# Patient Record
Sex: Female | Born: 1955 | Race: White | Hispanic: No | Marital: Married | Smoking: Never smoker
Health system: Southern US, Community
[De-identification: ages and names within clinical notes are randomized; demographics above are authoritative.]

## PROBLEM LIST (undated history)

## (undated) DIAGNOSIS — S83207A Unspecified tear of unspecified meniscus, current injury, left knee, initial encounter: Secondary | ICD-10-CM

## (undated) HISTORY — PX: TONSILLECTOMY: SUR1361

## (undated) HISTORY — DX: Unspecified tear of unspecified meniscus, current injury, left knee, initial encounter: S83.207A

## (undated) HISTORY — PX: WISDOM TOOTH EXTRACTION: SHX21

---

## 1998-08-25 ENCOUNTER — Other Ambulatory Visit: Admission: RE | Admit: 1998-08-25 | Discharge: 1998-08-25 | Payer: Self-pay | Admitting: Obstetrics and Gynecology

## 1999-03-01 ENCOUNTER — Encounter: Payer: Self-pay | Admitting: Emergency Medicine

## 1999-03-01 ENCOUNTER — Emergency Department (HOSPITAL_COMMUNITY): Admission: EM | Admit: 1999-03-01 | Discharge: 1999-03-01 | Payer: Self-pay | Admitting: Emergency Medicine

## 2000-08-14 ENCOUNTER — Ambulatory Visit (HOSPITAL_COMMUNITY): Admission: RE | Admit: 2000-08-14 | Discharge: 2000-08-14 | Payer: Self-pay | Admitting: Gastroenterology

## 2001-02-21 ENCOUNTER — Other Ambulatory Visit: Admission: RE | Admit: 2001-02-21 | Discharge: 2001-02-21 | Payer: Self-pay

## 2001-04-11 ENCOUNTER — Encounter: Payer: Self-pay | Admitting: Obstetrics and Gynecology

## 2001-04-11 ENCOUNTER — Ambulatory Visit (HOSPITAL_COMMUNITY): Admission: RE | Admit: 2001-04-11 | Discharge: 2001-04-11 | Payer: Self-pay | Admitting: Obstetrics and Gynecology

## 2003-11-27 ENCOUNTER — Other Ambulatory Visit: Admission: RE | Admit: 2003-11-27 | Discharge: 2003-11-27 | Payer: Self-pay | Admitting: Obstetrics and Gynecology

## 2006-06-27 ENCOUNTER — Other Ambulatory Visit: Admission: RE | Admit: 2006-06-27 | Discharge: 2006-06-27 | Payer: Self-pay | Admitting: Obstetrics and Gynecology

## 2011-01-09 ENCOUNTER — Encounter: Payer: Self-pay | Admitting: Obstetrics & Gynecology

## 2013-11-28 ENCOUNTER — Emergency Department (HOSPITAL_COMMUNITY): Payer: BC Managed Care – PPO

## 2013-11-28 ENCOUNTER — Encounter (HOSPITAL_COMMUNITY): Payer: Self-pay | Admitting: Emergency Medicine

## 2013-11-28 ENCOUNTER — Emergency Department (HOSPITAL_COMMUNITY)
Admission: EM | Admit: 2013-11-28 | Discharge: 2013-11-28 | Disposition: A | Discharge status: Home / Self Care | Payer: BC Managed Care – PPO | Attending: Emergency Medicine | Admitting: Emergency Medicine

## 2013-11-28 DIAGNOSIS — R6883 Chills (without fever): Secondary | ICD-10-CM | POA: Insufficient documentation

## 2013-11-28 DIAGNOSIS — R911 Solitary pulmonary nodule: Secondary | ICD-10-CM

## 2013-11-28 DIAGNOSIS — R52 Pain, unspecified: Secondary | ICD-10-CM | POA: Insufficient documentation

## 2013-11-28 DIAGNOSIS — M546 Pain in thoracic spine: Secondary | ICD-10-CM | POA: Insufficient documentation

## 2013-11-28 DIAGNOSIS — R109 Unspecified abdominal pain: Secondary | ICD-10-CM | POA: Insufficient documentation

## 2013-11-28 LAB — COMPREHENSIVE METABOLIC PANEL
ALT: 20 U/L (ref 0–35)
Alkaline Phosphatase: 72 U/L (ref 39–117)
BUN: 21 mg/dL (ref 6–23)
CO2: 24 mEq/L (ref 19–32)
Chloride: 106 mEq/L (ref 96–112)
GFR calc Af Amer: 81 mL/min — ABNORMAL LOW (ref >90)
GFR calc non Af Amer: 70 mL/min — ABNORMAL LOW (ref >90)
Glucose, Bld: 103 mg/dL — ABNORMAL HIGH (ref 70–99)
Potassium: 3.6 mEq/L (ref 3.5–5.1)
Sodium: 141 mEq/L (ref 135–145)
Total Bilirubin: 0.3 mg/dL (ref 0.3–1.2)
Total Protein: 7 g/dL (ref 6.0–8.3)

## 2013-11-28 LAB — URINALYSIS, ROUTINE W REFLEX MICROSCOPIC
Bilirubin Urine: NEGATIVE
Hgb urine dipstick: NEGATIVE
Nitrite: NEGATIVE
Specific Gravity, Urine: 1.019 (ref 1.005–1.030)
Urobilinogen, UA: 0.2 mg/dL (ref 0.0–1.0)

## 2013-11-28 LAB — CBC WITH DIFFERENTIAL/PLATELET
Eosinophils Relative: 1 % (ref 0–5)
Hemoglobin: 13.7 g/dL (ref 12.0–15.0)
Lymphocytes Relative: 29 % (ref 12–46)
Lymphs Abs: 2.1 10*3/uL (ref 0.7–4.0)
Monocytes Absolute: 0.4 10*3/uL (ref 0.1–1.0)
Monocytes Relative: 5 % (ref 3–12)
Neutro Abs: 4.7 10*3/uL (ref 1.7–7.7)
Neutrophils Relative %: 64 % (ref 43–77)
Platelets: 220 10*3/uL (ref 150–400)
RBC: 4.43 MIL/uL (ref 3.87–5.11)
WBC: 7.3 10*3/uL (ref 4.0–10.5)

## 2013-11-28 LAB — LIPASE, BLOOD: Lipase: 62 U/L — ABNORMAL HIGH (ref 11–59)

## 2013-11-28 MED ORDER — DIAZEPAM 5 MG PO TABS: 5.0000 mg | ORAL_TABLET | Freq: Four times a day (QID) | ORAL | Status: DC | PRN

## 2013-11-28 MED ORDER — HYDROCODONE-ACETAMINOPHEN 5-325 MG PO TABS: 2.0000 | ORAL_TABLET | ORAL | Status: DC | PRN

## 2013-11-28 MED ORDER — HYDROMORPHONE HCL PF 1 MG/ML IJ SOLN
0.5000 mg | Freq: Once | INTRAMUSCULAR | Status: AC
  Administered 2013-11-28: 0.5 mg via INTRAVENOUS
  Filled 2013-11-28: qty 1

## 2013-11-28 MED ORDER — IOHEXOL 300 MG/ML  SOLN
80.0000 mL | Freq: Once | INTRAMUSCULAR | Status: AC | PRN
  Administered 2013-11-28: 80 mL via INTRAVENOUS

## 2013-11-28 MED ORDER — KETOROLAC TROMETHAMINE 30 MG/ML IJ SOLN
30.0000 mg | Freq: Once | INTRAMUSCULAR | Status: AC
  Administered 2013-11-28: 30 mg via INTRAVENOUS
  Filled 2013-11-28: qty 1

## 2013-11-28 MED ORDER — ONDANSETRON HCL 4 MG/2ML IJ SOLN
4.0000 mg | Freq: Once | INTRAMUSCULAR | Status: AC
  Administered 2013-11-28: 4 mg via INTRAVENOUS
  Filled 2013-11-28: qty 2

## 2013-11-28 MED ORDER — MORPHINE SULFATE 4 MG/ML IJ SOLN
4.0000 mg | Freq: Once | INTRAMUSCULAR | Status: AC
  Administered 2013-11-28: 4 mg via INTRAVENOUS
  Filled 2013-11-28: qty 1

## 2013-11-28 MED ORDER — IOHEXOL 300 MG/ML  SOLN
25.0000 mL | INTRAMUSCULAR | Status: AC
  Administered 2013-11-28: 25 mL via ORAL

## 2013-11-28 NOTE — ED Notes (Signed)
DR Jodi Mourning IN TO DISCUSS RESULTS AND DISCHARGE PLAN WITH PATIENT

## 2013-11-28 NOTE — ED Notes (Signed)
Report given to Anette, RN, Pt. Will be transferred to Pod C to wait for results.  Pt. Updated with plan of care

## 2013-11-28 NOTE — ED Provider Notes (Signed)
Signed out with right flank pain, CT pending. Rechecked, pain meds given, pt comfortable as long as not moving. Right flank/ paraspinal tenderness on exam. MSK? Ct Abdomen Pelvis W Wo Contrast  11/28/2013   CLINICAL DATA:  Right flank pain.  EXAM: CT ABDOMEN AND PELVIS WITHOUT AND WITH CONTRAST  TECHNIQUE: Multidetector CT imaging of the abdomen and pelvis was performed without contrast material in one or both body regions, followed by contrast material(s) and further sections in one or both body regions.  CONTRAST:  80mL OMNIPAQUE IOHEXOL 300 MG/ML  SOLN  COMPARISON:  None.  FINDINGS: A 5 mm noncalcified pulmonary nodules noted in the right lower lobe on image 6 of series 8. No evidence of renal calculi or hydronephrosis. No evidence of ureteral calculi or dilatation. No bladder calculi identified.  The liver, gallbladder, pancreas, spleen, adrenal glands, and kidneys are normal in appearance. No evidence of hydronephrosis. No soft tissue masses or lymphadenopathy identified.  Uterus and adnexal regions are unremarkable. No evidence of inflammatory process or abnormal fluid collections. Normal appendix is visualized. No evidence of bowel wall thickening, dilatation, or hernia.  IMPRESSION: No acute findings or other significant abnormality within the abdomen or pelvis.  5 mm indeterminate pulmonary nodule in the left lower lobe. If the patient is at high risk for bronchogenic carcinoma, follow-up chest CT at 6-12 months is recommended. If the patient is at low risk for bronchogenic carcinoma, follow-up chest CT at 12 months is recommended. This recommendation follows the consensus statement: Guidelines for Management of Small Pulmonary Nodules Detected on CT Scans: A Statement from the Fleischner Society as published in Radiology 2005;237:395-400.   Electronically Signed   By: Myles Rosenthal M.D.   On: 11/28/2013 10:35   Dg Chest 2 View  11/28/2013   CLINICAL DATA:  Right flank pain.  EXAM: CHEST  2 VIEW   COMPARISON:  None.  FINDINGS: The lungs are well-aerated and clear. There is no evidence of focal opacification, pleural effusion or pneumothorax.  The heart is normal in size; the mediastinal contour is within normal limits. No acute osseous abnormalities are seen.  IMPRESSION: No acute cardiopulmonary process seen.   Electronically Signed   By: Roanna Raider M.D.   On: 11/28/2013 06:22   CT no acute findings, discussed CT fup in 1 yr for nodule.  Pt comfortable with plan and fup discussed.   Hidaya Daniel M  Right flank pain  Enid Skeens, MD 11/28/13 [phone]

## 2013-11-28 NOTE — ED Notes (Signed)
Pt has arrived on pod c and her ct is resulted. Dr Jodi Mourning is aware and is preparing pt discharge instructions

## 2013-11-28 NOTE — ED Provider Notes (Signed)
CSN: [id number]     Arrival date & time 11/28/13  0443 History   First MD Initiated Contact with Patient 11/28/13 [phone]     Chief Complaint  Patient presents with  . Flank Pain   (Consider location/radiation/quality/duration/timing/severity/associated sxs/prior Treatment) HPI Patient presents with 2 days of stabbing right-sided flank pain. The pain is worse with movement. No known trauma or inciting event. Was seen by her primary care Dr. and had labs/urine which were normal. Patient states the pain has worsened tonight. She's had no shortness of breath or cough. She denies any fevers had one episode of chills. She's had no lower extremity swelling or pain. She denies any nausea or vomiting. She's had no diarrhea or constipation. She denies urinary frequency or hematuria. She's been taking Tylenol at home with minimal relief. Denies recent extended travel or surgery. History reviewed. No pertinent past medical history. Past Surgical History  Procedure Laterality Date  . Cesarean section    . Tonsillectomy    . Wisdom tooth extraction     No family history on file. History  Substance Use Topics  . Smoking status: Never Smoker   . Smokeless tobacco: Not on file  . Alcohol Use: Yes   OB History   Grav Para Term Preterm Abortions TAB SAB Ect Mult Living                 Review of Systems  Constitutional: Positive for chills. Negative for fever.  Respiratory: Negative for cough and wheezing.   Cardiovascular: Negative for chest pain, palpitations and leg swelling.  Gastrointestinal: Negative for nausea, vomiting, abdominal pain, diarrhea and constipation.  Genitourinary: Positive for flank pain. Negative for dysuria, frequency and hematuria.  Musculoskeletal: Positive for back pain and myalgias. Negative for neck pain and neck stiffness.  Skin: Negative for rash and wound.  Neurological: Negative for dizziness, weakness, light-headedness, numbness and headaches.  All other systems  reviewed and are negative.    Allergies  Demerol  Home Medications  No current outpatient prescriptions on file. BP 136/73  Pulse 74  Temp(Src) 97.7 F (36.5 C) (Oral)  Resp 16  Ht 5' 8.5" (1.74 m)  SpO2 100% Physical Exam  Nursing note and vitals reviewed. Constitutional: She is oriented to person, place, and time. She appears well-developed and well-nourished. No distress.  HENT:  Head: Normocephalic and atraumatic.  Mouth/Throat: Oropharynx is clear and moist.  Eyes: EOM are normal. Pupils are equal, round, and reactive to light.  Neck: Normal range of motion. Neck supple.  Cardiovascular: Normal rate and regular rhythm.   Pulmonary/Chest: Effort normal and breath sounds normal. No respiratory distress. She has no wheezes. She has no rales. She exhibits no tenderness.  Abdominal: Soft. Bowel sounds are normal. She exhibits no distension and no mass. There is no tenderness. There is no rebound and no guarding.  Musculoskeletal: Normal range of motion. She exhibits tenderness. She exhibits no edema.  Patient has tenderness to palpation on the right lateral inferior thoracic region. She has no crepitance or deformity. The pain is worse with movement. The patient has no CVA tenderness bilaterally. She has no midline thoracic or lumbar tenderness. She has no calf swelling or pain.  Neurological: She is alert and oriented to person, place, and time.  Patient is alert and oriented x3 with clear, goal oriented speech. Patient has 5/5 motor in all extremities. Sensation is intact to light touch.  Skin: Skin is warm and dry. No rash noted. No erythema.  Psychiatric: She  has a normal mood and affect. Her behavior is normal.    ED Course  Procedures (including critical care time) Labs Review Labs Reviewed  CBC WITH DIFFERENTIAL  COMPREHENSIVE METABOLIC PANEL  LIPASE, BLOOD  URINALYSIS, ROUTINE W REFLEX MICROSCOPIC  D-DIMER, QUANTITATIVE   Imaging Review No results found.  EKG  Interpretation   None       MDM     Loren Racer, MD 12/04/13 [phone]

## 2013-11-28 NOTE — ED Notes (Signed)
PT AMBULATORY TO RESTROOM TO DRESS

## 2013-11-28 NOTE — ED Notes (Addendum)
Pt states she started having right flank pain on Monday of this week, pt states she was seen by her primary care doctor and he wanted to pursue a CT scan. Pt declined CT scan as she thought it would get better. Pt states the CT scan is necessary now. Pt states the pain feels like something is clamping down, pt states when the pain is at its worst she has to hunch forward. Pt denies problems urinating, passing stool, SOB, N/V/D, fever. Pt is A&O X4. Pt reports pain with walking, laying, sitting.

## 2014-11-09 ENCOUNTER — Emergency Department (HOSPITAL_COMMUNITY)
Admission: EM | Admit: 2014-11-09 | Discharge: 2014-11-09 | Disposition: A | Discharge status: Home / Self Care | Payer: BC Managed Care – PPO | Attending: Emergency Medicine | Admitting: Emergency Medicine

## 2014-11-09 ENCOUNTER — Encounter (HOSPITAL_COMMUNITY): Payer: Self-pay | Admitting: *Deleted

## 2014-11-09 ENCOUNTER — Emergency Department (HOSPITAL_COMMUNITY): Payer: BC Managed Care – PPO

## 2014-11-09 DIAGNOSIS — M79602 Pain in left arm: Secondary | ICD-10-CM | POA: Diagnosis not present

## 2014-11-09 DIAGNOSIS — R6884 Jaw pain: Secondary | ICD-10-CM

## 2014-11-09 DIAGNOSIS — Z79899 Other long term (current) drug therapy: Secondary | ICD-10-CM | POA: Insufficient documentation

## 2014-11-09 DIAGNOSIS — Z7982 Long term (current) use of aspirin: Secondary | ICD-10-CM | POA: Diagnosis not present

## 2014-11-09 LAB — CBC
HCT: 42.5 % (ref 36.0–46.0)
HEMOGLOBIN: 14.3 g/dL (ref 12.0–15.0)
MCH: 30.1 pg (ref 26.0–34.0)
MCHC: 33.6 g/dL (ref 30.0–36.0)
MCV: 89.5 fL (ref 78.0–100.0)
PLATELETS: 246 10*3/uL (ref 150–400)
RBC: 4.75 MIL/uL (ref 3.87–5.11)
RDW: 12.1 % (ref 11.5–15.5)
WBC: 5.6 10*3/uL (ref 4.0–10.5)

## 2014-11-09 LAB — BASIC METABOLIC PANEL
ANION GAP: 12 (ref 5–15)
BUN: 12 mg/dL (ref 6–23)
CALCIUM: 9 mg/dL (ref 8.4–10.5)
CO2: 28 mEq/L (ref 19–32)
Chloride: 104 mEq/L (ref 96–112)
Creatinine, Ser: 0.83 mg/dL (ref 0.50–1.10)
GFR, EST AFRICAN AMERICAN: 88 mL/min — AB (ref >90)
GFR, EST NON AFRICAN AMERICAN: 76 mL/min — AB (ref >90)
Glucose, Bld: 95 mg/dL (ref 70–99)
Potassium: 3.5 mEq/L — ABNORMAL LOW (ref 3.7–5.3)
Sodium: 144 mEq/L (ref 137–147)

## 2014-11-09 LAB — I-STAT TROPONIN, ED: TROPONIN I, POC: 0 ng/mL (ref 0.00–0.08)

## 2014-11-09 LAB — TROPONIN I

## 2014-11-09 MED ORDER — ASPIRIN 81 MG PO CHEW
324.0000 mg | CHEWABLE_TABLET | Freq: Once | ORAL | Status: AC
  Administered 2014-11-09: 324 mg via ORAL
  Filled 2014-11-09: qty 4

## 2014-11-09 NOTE — ED Notes (Signed)
thge pt has has hgad lt jhaw pain for 3 days and today she has had lt arm pain while walking.  boith her upper and lower teeth are painful.   lmp   None   She has had bauysea 3 daysbago when the jaw pain started.

## 2014-11-09 NOTE — ED Provider Notes (Signed)
CSN: [id number]     Arrival date & time 11/09/14  1735 History   First MD Initiated Contact with Patient 11/09/14 1803     Chief Complaint  Patient presents with  . Jaw Pain     (Consider location/radiation/quality/duration/timing/severity/associated sxs/prior Treatment) HPI Comments: Had L sided jaw pain all night 2 nights ago. L sided, entire upper and lower row of teeth. Patient had just seen dentist recently and had a good checkup.  No fevers, no vomiting.  Patient is a 58 y.o. female presenting with extremity pain. The history is provided by the patient.  Extremity Pain This is a new problem. The current episode started 3 to 5 hours ago. The problem occurs constantly. The problem has been resolved. Pertinent negatives include no abdominal pain and no shortness of breath. Exacerbated by: exertion. The symptoms are relieved by rest. She has tried nothing for the symptoms. The treatment provided no relief.    History reviewed. No pertinent past medical history. Past Surgical History  Procedure Laterality Date  . Cesarean section    . Tonsillectomy    . Wisdom tooth extraction     No family history on file. History  Substance Use Topics  . Smoking status: Never Smoker   . Smokeless tobacco: Not on file  . Alcohol Use: Yes   OB History    No data available     Review of Systems  Constitutional: Negative for fever.  Respiratory: Negative for cough and shortness of breath.   Gastrointestinal: Positive for nausea. Negative for vomiting and abdominal pain.  All other systems reviewed and are negative.     Allergies  Demerol  Home Medications   Prior to Admission medications   Medication Sig Start Date End Date Taking? Authorizing Provider  aspirin 81 MG chewable tablet Chew 81 mg by mouth daily.    Historical Provider, MD  cholecalciferol (VITAMIN D) 1000 UNITS tablet Take 1,000 Units by mouth daily.    Historical Provider, MD  diazepam (VALIUM) 5 MG tablet Take 1  tablet (5 mg total) by mouth every 6 (six) hours as needed for anxiety (spasms). 11/28/13   Enid SkeensJoshua M Zavitz, MD  doxylamine, Sleep, (UNISOM) 25 MG tablet Take 25 mg by mouth at bedtime as needed.    Historical Provider, MD  HYDROcodone-acetaminophen (NORCO) 5-325 MG per tablet Take 2 tablets by mouth every 4 (four) hours as needed. 11/28/13   Enid SkeensJoshua M Zavitz, MD  Melatonin 1 MG CAPS Take 1 capsule by mouth at bedtime as needed.    Historical Provider, MD  vitamin B-12 (CYANOCOBALAMIN) 1000 MCG tablet Take 1,000 mcg by mouth daily.    Historical Provider, MD   BP 144/64 mmHg  Pulse 77  Temp(Src) 98 F (36.7 C)  Resp 15  Ht 5' 8.5" (1.74 m)  Wt 244 lb (110.678 kg)  BMI 36.56 kg/m2  SpO2 99% Physical Exam  Constitutional: She is oriented to person, place, and time. She appears well-developed and well-nourished. No distress.  HENT:  Head: Normocephalic and atraumatic.  Mouth/Throat: Oropharynx is clear and moist. No oropharyngeal exudate.  Eyes: EOM are normal. Pupils are equal, round, and reactive to light.  Neck: Normal range of motion. Neck supple.  Cardiovascular: Normal rate and regular rhythm.  Exam reveals no friction rub.   No murmur heard. Pulmonary/Chest: Effort normal and breath sounds normal. No respiratory distress. She has no wheezes. She has no rales.  Abdominal: Soft. She exhibits no distension. There is no tenderness. There is no  rebound.  Musculoskeletal: Normal range of motion. She exhibits no edema.  Neurological: She is alert and oriented to person, place, and time.  Skin: No rash noted. She is not diaphoretic.  Nursing note and vitals reviewed.   ED Course  Procedures (including critical care time) Labs Review Labs Reviewed  CBC  BASIC METABOLIC PANEL  TROPONIN I    Imaging Review Dg Chest 2 View  11/09/2014   CLINICAL DATA:  Left-sided jaw pain.  EXAM: CHEST  2 VIEW  COMPARISON:  11/28/2013  FINDINGS: The heart size and mediastinal contours are within  normal limits. Both lungs are clear. The visualized skeletal structures are unremarkable.  IMPRESSION: No active cardiopulmonary disease.   Electronically Signed   By: Signa Kellaylor  Stroud M.D.   On: 11/09/2014 19:14     EKG Interpretation   Date/Time:  Sunday November 09 2014 17:48:33 EST Ventricular Rate:  92 PR Interval:  150 QRS Duration: 86 QT Interval:  346 QTC Calculation: 427 R Axis:   82 Text Interpretation:  Normal sinus rhythm Normal ECG No prior for  comparison Confirmed by Gwendolyn GrantWALDEN  MD, Jamarious Febo ([id number]) on 11/09/2014 6:03:51 PM      MDM   Final diagnoses:  Pain of left upper extremity    58 year old female here with symptoms concerning for possible ACS. She had a multiple hour episode of left-sided jaw pain all night 2 nights ago. Today she had exertional left arm pain when walking briskly in the cold. This resolved about 3-4 hours ago. Had some mild nausea with each episode, but no diaphoresis, no shortness of breath, no chest pain or pressure. She has no family history of cardiac disease and no risk factors other than obesity. Patient is a low risk heart score. EKG is normal. Plan on serial enzymes. I discussed patient's options of staying for cardiology bowel versus going home and following up for a stress test that she would rather follow-up as an outpatient. Pain free at this time. Will give aspirin.  Serial troponins normal. Stable for discharge.   Elwin MochaBlair Patrena Santalucia, MD 11/09/14 2312

## 2014-11-09 NOTE — Discharge Instructions (Signed)
Please take a 324 mg aspirin daily until you see your regular doctor.  Chest Pain (Nonspecific) It is often hard to give a specific diagnosis for the cause of chest pain. There is always a chance that your pain could be related to something serious, such as a heart attack or a blood clot in the lungs. You need to follow up with your health care provider for further evaluation. CAUSES   Heartburn.  Pneumonia or bronchitis.  Anxiety or stress.  Inflammation around your heart (pericarditis) or lung (pleuritis or pleurisy).  A blood clot in the lung.  A collapsed lung (pneumothorax). It can develop suddenly on its own (spontaneous pneumothorax) or from trauma to the chest.  Shingles infection (herpes zoster virus). The chest wall is composed of bones, muscles, and cartilage. Any of these can be the source of the pain.  The bones can be bruised by injury.  The muscles or cartilage can be strained by coughing or overwork.  The cartilage can be affected by inflammation and become sore (costochondritis). DIAGNOSIS  Lab tests or other studies may be needed to find the cause of your pain. Your health care provider may have you take a test called an ambulatory electrocardiogram (ECG). An ECG records your heartbeat patterns over a 24-hour period. You may also have other tests, such as:  Transthoracic echocardiogram (TTE). During echocardiography, sound waves are used to evaluate how blood flows through your heart.  Transesophageal echocardiogram (TEE).  Cardiac monitoring. This allows your health care provider to monitor your heart rate and rhythm in real time.  Holter monitor. This is a portable device that records your heartbeat and can help diagnose heart arrhythmias. It allows your health care provider to track your heart activity for several days, if needed.  Stress tests by exercise or by giving medicine that makes the heart beat faster. TREATMENT   Treatment depends on what may be  causing your chest pain. Treatment may include:  Acid blockers for heartburn.  Anti-inflammatory medicine.  Pain medicine for inflammatory conditions.  Antibiotics if an infection is present.  You may be advised to change lifestyle habits. This includes stopping smoking and avoiding alcohol, caffeine, and chocolate.  You may be advised to keep your head raised (elevated) when sleeping. This reduces the chance of acid going backward from your stomach into your esophagus. Most of the time, nonspecific chest pain will improve within 2-3 days with rest and mild pain medicine.  HOME CARE INSTRUCTIONS   If antibiotics were prescribed, take them as directed. Finish them even if you start to feel better.  For the next few days, avoid physical activities that bring on chest pain. Continue physical activities as directed.  Do not use any tobacco products, including cigarettes, chewing tobacco, or electronic cigarettes.  Avoid drinking alcohol.  Only take medicine as directed by your health care provider.  Follow your health care provider's suggestions for further testing if your chest pain does not go away.  Keep any follow-up appointments you made. If you do not go to an appointment, you could develop lasting (chronic) problems with pain. If there is any problem keeping an appointment, call to reschedule. SEEK MEDICAL CARE IF:   Your chest pain does not go away, even after treatment.  You have a rash with blisters on your chest.  You have a fever. SEEK IMMEDIATE MEDICAL CARE IF:   You have increased chest pain or pain that spreads to your arm, neck, jaw, back, or abdomen.  You have shortness of breath.  You have an increasing cough, or you cough up blood.  You have severe back or abdominal pain.  You feel nauseous or vomit.  You have severe weakness.  You faint.  You have chills. This is an emergency. Do not wait to see if the pain will go away. Get medical help at once.  Call your local emergency services (911 in [place] Do not drive yourself to the hospital. MAKE SURE YOU:   Understand these instructions.  Will watch your condition.  Will get help right away if you are not doing well or get worse. Document Released: 09/14/2005 Document Revised: 12/10/2013 Document Reviewed: 07/10/2008 [facility] Patient Information 2015 [place], [place]. This information is not intended to replace advice given to you by your health care provider. Make sure you discuss any questions you have with your health care provider.

## 2014-11-09 NOTE — ED Notes (Signed)
Pt transported to xray 

## 2014-11-25 ENCOUNTER — Other Ambulatory Visit: Payer: Self-pay

## 2014-11-25 DIAGNOSIS — Z1231 Encounter for screening mammogram for malignant neoplasm of breast: Secondary | ICD-10-CM

## 2014-12-16 ENCOUNTER — Ambulatory Visit: Payer: BC Managed Care – PPO

## 2014-12-19 DIAGNOSIS — S83207A Unspecified tear of unspecified meniscus, current injury, left knee, initial encounter: Secondary | ICD-10-CM

## 2014-12-19 HISTORY — DX: Unspecified tear of unspecified meniscus, current injury, left knee, initial encounter: S83.207A

## 2015-01-08 ENCOUNTER — Encounter: Payer: Self-pay | Admitting: Neurology

## 2015-01-13 ENCOUNTER — Encounter: Payer: Self-pay | Admitting: Neurology

## 2015-01-13 ENCOUNTER — Ambulatory Visit (INDEPENDENT_AMBULATORY_CARE_PROVIDER_SITE_OTHER): Payer: BLUE CROSS/BLUE SHIELD | Admitting: Neurology

## 2015-01-13 VITALS — BP 116/73 | HR 83 | Resp 22 | Ht 68.5 in | Wt 248.0 lb

## 2015-01-13 DIAGNOSIS — G4726 Circadian rhythm sleep disorder, shift work type: Secondary | ICD-10-CM

## 2015-01-13 DIAGNOSIS — F5102 Adjustment insomnia: Secondary | ICD-10-CM | POA: Insufficient documentation

## 2015-01-13 DIAGNOSIS — R0683 Snoring: Secondary | ICD-10-CM

## 2015-01-13 MED ORDER — ZOLPIDEM TARTRATE 5 MG PO TABS: 5.0000 mg | ORAL_TABLET | Freq: Every evening | ORAL | Status: DC | PRN

## 2015-01-13 NOTE — Patient Instructions (Signed)
Insomnia Insomnia is frequent trouble falling and/or staying asleep. Insomnia can be a long term problem or a short term problem. Both are common. Insomnia can be a short term problem when the wakefulness is related to a certain stress or worry. Long term insomnia is often related to ongoing stress during waking hours and/or poor sleeping habits. Overtime, sleep deprivation itself can make the problem worse. Every little thing feels more severe because you are overtired and your ability to cope is decreased. CAUSES   Stress, anxiety, and depression.  Poor sleeping habits.  Distractions such as TV in the bedroom.  Naps close to bedtime.  Engaging in emotionally charged conversations before bed.  Technical reading before sleep.  Alcohol and other sedatives. They may make the problem worse. They can hurt normal sleep patterns and normal dream activity.  Stimulants such as caffeine for several hours prior to bedtime.  Pain syndromes and shortness of breath can cause insomnia.  Exercise late at night.  Changing time zones may cause sleeping problems (jet lag). It is sometimes helpful to have someone observe your sleeping patterns. They should look for periods of not breathing during the night (sleep apnea). They should also look to see how long those periods last. If you live alone or observers are uncertain, you can also be observed at a sleep clinic where your sleep patterns will be professionally monitored. Sleep apnea requires a checkup and treatment. Give your caregivers your medical history. Give your caregivers observations your family has made about your sleep.  SYMPTOMS   Not feeling rested in the morning.  Anxiety and restlessness at bedtime.  Difficulty falling and staying asleep. TREATMENT   Your caregiver may prescribe treatment for an underlying medical disorders. Your caregiver can give advice or help if you are using alcohol or other drugs for self-medication. Treatment  of underlying problems will usually eliminate insomnia problems.  Medications can be prescribed for short time use. They are generally not recommended for lengthy use.  Over-the-counter sleep medicines are not recommended for lengthy use. They can be habit forming.  You can promote easier sleeping by making lifestyle changes such as:  Using relaxation techniques that help with breathing and reduce muscle tension.  Exercising earlier in the day.  Changing your diet and the time of your last meal. No night time snacks.  Establish a regular time to go to bed.  Counseling can help with stressful problems and worry.  Soothing music and white noise may be helpful if there are background noises you cannot remove.  Stop tedious detailed work at least one hour before bedtime. HOME CARE INSTRUCTIONS   Keep a diary. Inform your caregiver about your progress. This includes any medication side effects. See your caregiver regularly. Take note of:  Times when you are asleep.  Times when you are awake during the night.  The quality of your sleep.  How you feel the next day. This information will help your caregiver care for you.  Get out of bed if you are still awake after 15 minutes. Read or do some quiet activity. Keep the lights down. Wait until you feel sleepy and go back to bed.  Keep regular sleeping and waking hours. Avoid naps.  Exercise regularly.  Avoid distractions at bedtime. Distractions include watching television or engaging in any intense or detailed activity like attempting to balance the household checkbook.  Develop a bedtime ritual. Keep a familiar routine of bathing, brushing your teeth, climbing into bed at the same   time each night, listening to soothing music. Routines increase the success of falling to sleep faster.  Use relaxation techniques. This can be using breathing and muscle tension release routines. It can also include visualizing peaceful scenes. You can  also help control troubling or intruding thoughts by keeping your mind occupied with boring or repetitive thoughts like the old concept of counting sheep. You can make it more creative like imagining planting one beautiful flower after another in your backyard garden.  During your day, work to eliminate stress. When this is not possible use some of the previous suggestions to help reduce the anxiety that accompanies stressful situations. MAKE SURE YOU:   Understand these instructions.  Will watch your condition.  Will get help right away if you are not doing well or get worse. Document Released: 12/02/2000 Document Revised: 02/27/2012 Document Reviewed: 01/02/2008 [employer] Patient Information 2015 [employer]. This information is not intended to replace advice given to you by your health care provider. Make sure you discuss any questions you have with your health care provider.  

## 2015-01-13 NOTE — Progress Notes (Signed)
SLEEP MEDICINE CLINIC   Provider:  Melvyn Novas, M D  Referring Provider: Ezequiel Kayser, MD Primary Care Physician:  Ezequiel Kayser, MD  Chief Complaint  Patient presents with  . [facility] Sleep consult    Rm 11, alone    HPI:  Shelley Bean is a 59 y.o. female seen here as a referral from Dr. Waynard Edwards for a sleep consultation.   Shelley Bean reports that she has trouble falling asleep rather than staying asleep.  in the morning She has also history of snoring, witnessed by her husband. He has not reported that the patient is apneic but that she seems to snore around 4 AM . The patient relates the changes in her sleep pattern to weight gain, over the last 4-5 years about 45 pounds.  She injured her back and was unable to exercise.   As long as she raised her young children she was a less sound sleeper and more in need of being aware of her sleep surrounding. That is no longer the case yet she feels that she has not returned to a sound sleeping pattern. She endorsed the fatigue severity scale at 22 points and the Epworth Sleepiness Scale at 3 points which are not indicative of either fatigue or daytime sleepiness being present. She reports further that she likes to play on her computer or watching movies streaming on her computer and that the blue light or screen light seems to decrease her ability to fall asleep shortly after. Just recently she learned that this may be the cause of some of her sleep problems and she is in the process of implementing changes to her nocturnal habits. Shelley Bean  husband has notched her at times to roll over and she will not snore when not sleeping on her back or side, is still while sleeping prone.  She was told that she always breathes through the mouth. She has had a physical exam recently and was told by Dr. Waynard Edwards that her upper air way is narrow. 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   The patient has established a pattern that leads her to bed at  about 2 AM, about 30 minutes before she goes to bed she will take Unisom and melatonin. This allows her to fall asleep within about 30 minutes or less. She has been taking these supplements for over 3 years now. While on vacation once she  forgot these supplements and was unable to sleep.  She is usually able to sleep through the night when she takes her over-the-counter supplements, and rises at about 7 AM and often feels in need of a nap until 10 AM , feeling groggy.  She does not take any other daytime naps or afternoon naps. She will get about 5 hours of nocturnal sleep another 2 hours in the morning. She drinks 5 cups of coffee up to 6 PM, she is now aware of this being a possible insomnia contributor.   Dr. Waynard Edwards recently provided her with samples of Belsomra and the 5, 10 and 15 mg samples did not lead to the desired effect of falling asleep easier and on the contrary she awoke at 5 in the morning, wide awake.  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 The patient's husband Dr. Orvis Brill is a orthopedic surgeon was seems to do fine on 6 hours. The patient's father had witnessed snoring and apnea.     Review of Systems: Out  of a complete 14 system review, the patient complains of only the following symptoms, and all other reviewed systems are negative. Snoring, weight gain.   Epworth score  3 , Fatigue severity score 22  , depression score n/a .   History   Social History  . Marital Status: Married    Spouse Name: N/A    Number of Children: 3  . Years of Education: RN   Occupational History  . Not on file.   Social History Main Topics  . Smoking status: Never Smoker   . Smokeless tobacco: Not on file  . Alcohol Use: 0.0 oz/week    0 Not specified per week     Comment: socialholidays  . Drug Use: No  . Sexual Activity: Not on file   Other Topics Concern  . Not on file   Social History Narrative   Caffeine 5 cups daily avg.      History reviewed. No pertinent family history.  Past Medical History  Diagnosis Date  . Tear of meniscus of left knee 12/2014    wearing brace, doing PT    Past Surgical History  Procedure Laterality Date  . Cesarean section    . Tonsillectomy    . Wisdom tooth extraction      Current Outpatient Prescriptions  Medication Sig Dispense Refill  . acetaminophen (TYLENOL) 500 MG tablet Take 1,000 mg by mouth every 6 (six) hours as needed for headache (pain).    . Ascorbic Acid (VITAMIN C PO) Take 1 tablet by mouth at bedtime.    Marland Kitchen. aspirin 81 MG chewable tablet Chew 81 mg by mouth at bedtime.     . Cholecalciferol (VITAMIN D3) 5000 UNITS CAPS Take 5,000 Units by mouth at bedtime.    . Coenzyme Q10 (COQ10) 200 MG CAPS Take 200 mg by mouth at bedtime.    . Cyanocobalamin (VITAMIN B-12) 2500 MCG SUBL Place 2,500 mcg under the tongue at bedtime.    Marland Kitchen. doxylamine, Sleep, (UNISOM) 25 MG tablet Take 12.5 mg by mouth at bedtime as needed.     . Magnesium 250 MG TABS Take 250 mg by mouth at bedtime.    . Melatonin 3 MG TABS Take 3 mg by mouth at bedtime.     . Suvorexant (BELSOMRA PO) Take by mouth. Pt given samples 5mg , 10mg  20mg  samples to try (by Dr. Waynard EdwardsPerini).     No current facility-administered medications for this visit.    Allergies as of 01/13/2015 - Review Complete 01/13/2015  Allergen Reaction Noted  . Demerol [meperidine] Other (See Comments) 11/28/2013    Vitals: BP 116/73 mmHg  Pulse 83  Resp 22  Ht 5' 8.5" (1.74 m)  Wt 248 lb (112.492 kg)  BMI 37.16 kg/m2 Last Weight:  Wt Readings from Last 1 Encounters:  01/13/15 248 lb (112.492 kg)       Last Height:   Ht Readings from Last 1 Encounters:  01/13/15 5' 8.5" (1.74 m)    Physical exam:  General: The patient is awake, alert and appears not in acute distress. The patient is well groomed. Head: Normocephalic, atraumatic. Neck is supple. Mallampati 4 , invisible uvula  neck circumference: . Nasal airflow   unresricted , TMJ is not  evident . Retrognathia is seen.  Cardiovascular:  Regular rate and rhythm , without  murmurs or carotid bruit, and without distended neck veins. Respiratory: Lungs are clear to auscultation. Skin:  Without evidence of edema, or rash Trunk: BMI is  elevated and patient  has normal posture.  Neurologic exam : The patient is awake and alert, oriented to place and time.   Memory subjective   described as intact. There is a normal attention span & concentration ability.  Speech is fluent without   dysarthria, dysphonia or aphasia. Mood and affect are appropriate.  Cranial nerves: Pupils are equal and briskly reactive to light. Funduscopic exam without  evidence of pallor or edema.  Extraocular movements  in vertical and horizontal planes intact and without nystagmus. Visual fields by finger perimetry are intact. Hearing to finger rub intact.  Facial sensation intact to fine touch. Facial motor strength is symmetric and tongue and uvula move midline.  Motor exam:  Normal tone, muscle bulk and symmetric ,strength in all extremities.  Sensory:  Fine touch, pinprick and vibration were tested in all extremities.  Proprioception is tested in the upper extremities only. This was  normal.  Coordination: Rapid alternating movements in the fingers/hands is normal. Finger-to-nose maneuver without evidence of ataxia, dysmetria or tremor.  Gait and station: Patient walks without assistive device and is able unassisted to climb up to the exam table. Strength within normal limits.  She wears a knee brace.  Stance is stable and normal.  Deep tendon reflexes: in the  upper and lower extremities are symmetric and downgoing.   Assessment:  After physical and neurologic examination, review of laboratory studies, imaging, neurophysiology testing and pre-existing records, assessment is   1) main risk factor for snoring and possible apnea is her BMI, the patient gained weight over the  last 5 years and since has been snoring;  2) high grade mallompati and retrognathia, causing snoring, and risk factor for OSA.  3) insomnia is partially due to habits, light exposure and caffeine and partially due to shift work employment .  Nocturia 1-2 times is not a major factor.      The patient was advised of the nature of the diagnosed sleep disorder , the treatment options and risks for general a health and wellness arising from not treating the condition. Visit duration was 35 minutes. I advised Mrs. Ebling to eliminate blue light and screen light from the bedroom. I would like for her to try week by week to advance her bedtime by 15-20 minutes for that she finally hopefully has a bedtime of around midnight. In addition the bedroom should be called, quiet and dark. Caffeine should be restricted and only taken in up to lunchtime. I like for her to undergo a split night polysomnography as I suspect that she has obstructive sleep apnea to some degree. This will contribute to insomnia.  Melatonin should be continued , 5 mg at night.  Stop unisom as it makes her groggy.  She may try ambien 5 mg   Prn.   Plan:  Treatment plan and additional workup : see above.   CC Dr. Erline Hau Case Vassell MD  01/13/2015

## 2015-02-27 ENCOUNTER — Telehealth: Payer: Self-pay | Admitting: *Deleted

## 2015-02-27 NOTE — Telephone Encounter (Signed)
Left patient a message informing her that we needed to cancel her sleep study appointment.  She was advised to contact our office to reschedule with openings on Sunday and Wednesday of the following week.

## 2015-03-01 ENCOUNTER — Ambulatory Visit (INDEPENDENT_AMBULATORY_CARE_PROVIDER_SITE_OTHER): Payer: BLUE CROSS/BLUE SHIELD | Admitting: Neurology

## 2015-03-01 DIAGNOSIS — G4726 Circadian rhythm sleep disorder, shift work type: Secondary | ICD-10-CM

## 2015-03-01 DIAGNOSIS — G478 Other sleep disorders: Secondary | ICD-10-CM | POA: Diagnosis not present

## 2015-03-02 NOTE — Sleep Study (Signed)
Please see the scanned sleep study interpretation located in the Procedure tab within the Chart Review section. 

## 2015-03-10 ENCOUNTER — Encounter: Payer: Self-pay | Admitting: Neurology

## 2015-03-10 ENCOUNTER — Telehealth: Payer: Self-pay | Admitting: *Deleted

## 2015-03-10 ENCOUNTER — Other Ambulatory Visit: Payer: Self-pay | Admitting: Neurology

## 2015-03-10 ENCOUNTER — Encounter: Payer: Self-pay | Admitting: *Deleted

## 2015-03-10 DIAGNOSIS — G4733 Obstructive sleep apnea (adult) (pediatric): Secondary | ICD-10-CM

## 2015-03-10 NOTE — Telephone Encounter (Signed)
Patient was contacted and provided the results of her sleep study that did confirm a diagnosis of OSA.  Patient was advised that treatment in the form of CPAP therapy was considered effective in treatment and was recommended for home use.  The patient was in agreement and was referred to [facility] for CPAP set up.  Dr. Rodrigo RanMark Perini was faxed a copy of the report.  The patient gave verbal permission to mail a copy of her test results.   Patient instructed to contact our office 6-8 weeks post set up to schedule a follow up appointment.

## 2015-03-11 DIAGNOSIS — G4726 Circadian rhythm sleep disorder, shift work type: Secondary | ICD-10-CM | POA: Insufficient documentation

## 2015-04-13 ENCOUNTER — Encounter: Payer: Self-pay | Admitting: Neurology

## 2015-05-05 ENCOUNTER — Ambulatory Visit (INDEPENDENT_AMBULATORY_CARE_PROVIDER_SITE_OTHER): Payer: BLUE CROSS/BLUE SHIELD | Admitting: Neurology

## 2015-05-05 ENCOUNTER — Encounter: Payer: Self-pay | Admitting: Neurology

## 2015-05-05 VITALS — BP 120/70 | HR 72 | Resp 18 | Ht 68.9 in | Wt 258.0 lb

## 2015-05-05 DIAGNOSIS — Z9989 Dependence on other enabling machines and devices: Secondary | ICD-10-CM

## 2015-05-05 DIAGNOSIS — G4733 Obstructive sleep apnea (adult) (pediatric): Secondary | ICD-10-CM | POA: Diagnosis not present

## 2015-05-05 NOTE — Progress Notes (Signed)
SLEEP MEDICINE CLINIC   Provider:  Larey Seat, M D  Referring Provider: Crist Infante, MD Primary Care Physician:  Jerlyn Ly, MD  Chief Complaint  Patient presents with  . Follow-up    cpap, rm 10, alone    HPI:  Shelley Bean is a 59 y.o. female seen here as a referral from Dr. Joylene Draft for a sleep consultation.   Shelley Bean reports that she has trouble falling asleep rather than staying asleep.  in the morning She has also history of snoring, witnessed by her husband. He has not reported that the patient is apneic but that she seems to snore around 4 AM . The patient relates the changes in her sleep pattern to weight gain, over the last 4-5 years about 45 pounds.  She injured her back and was unable to exercise.   As long as she raised her young children she was a less sound sleeper and more in need of being aware of her sleep surrounding. That is no longer the case yet she feels that she has not returned to a sound sleeping pattern. She endorsed the fatigue severity scale at 22 points and the Epworth Sleepiness Scale at 3 points which are not indicative of either fatigue or daytime sleepiness being present. She reports further that she likes to play on her computer or watching movies streaming on her computer and that the blue light or screen light seems to decrease her ability to fall asleep shortly after. Just recently she learned that this may be the cause of some of her sleep problems and she is in the process of implementing changes to her nocturnal habits. Shelley Bean  husband has notched her at times to roll over and she will not snore when not sleeping on her back or side, is still while sleeping prone.  She was told that she always breathes through the mouth. She has had a physical exam recently and was told by Dr. Joylene Draft that her upper air way is narrow. 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   The patient has established a pattern that leads her to bed at about 2  AM, about 30 minutes before she goes to bed she will take Unisom and melatonin. This allows her to fall asleep within about 30 minutes or less. She has been taking these supplements for over 3 years now. While on vacation once she  forgot these supplements and was unable to sleep.  She is usually able to sleep through the night when she takes her over-the-counter supplements, and rises at about 7 AM and often feels in need of a nap until 10 AM , feeling groggy.  She does not take any other daytime naps or afternoon naps. She will get about 5 hours of nocturnal sleep another 2 hours in the morning. She drinks 5 cups of coffee up to 6 PM, she is now aware of this being a possible insomnia contributor.   Dr. Joylene Draft recently provided her with samples of Belsomra and the 5, 10 and 15 mg samples did not lead to the desired effect of falling asleep easier and on the contrary she awoke at 5 in the morning, wide awake.  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 The patient's husband Shelley Bean is a orthopedic surgeon was seems to do fine on 6 hours. The patient's father had witnessed snoring and apnea.   History from 05-05-15 Shelley Bean is here for his  first follow-up after her split night polysomnography which performed on 03-01-15. The patient has a very high RDI and AHI of 50.3 REM sleep was not seen and she spent none of her sleep in supine position. She had some periodic limb movements but none let to arousals her heart rate was stable she had isolated ectopies. After CPAP was initiated it was advanced to 9 cm water which she seemed to tolerate well and nasal mask by [employer] in medium size was used. The patient has been slowly getting adjusted to it but she said it was a challenge because she prefers to sleep problem and this is not possible with her headgear on. She was rather surprised that she would have that severe degree of sleep apnea ring was also noted. She  wakes up with impressions on her face from the nasal mask and she would probably prefer a pillow. Interested in a dream where mask and I can certainly change her prescription to that. We also met today to look at her download. She used the machine 100% of the time for the last 30 days and 29 days over 4 hours which makes and 97% compliant a good results. On average she sleeps 6 hours 35 minutes with the CPAP machine set at 9 cm water pressure with 3 cm EPR. She does have an AHI of 3.1. She does not use the humidifier.     Review of Systems: Out of a complete 14 system review, the patient complains of only the following symptoms, and all other reviewed systems are negative. Snoring, weight gain.   Epworth score  3 , Fatigue severity score 22  , depression score n/a .   History   Social History  . Marital Status: Married    Spouse Name: N/A  . Number of Children: 3  . Years of Education: RN   Occupational History  . Not on file.   Social History Main Topics  . Smoking status: Never Smoker   . Smokeless tobacco: Not on file  . Alcohol Use: 0.0 oz/week    0 Standard drinks or equivalent per week     Comment: socialholidays  . Drug Use: No  . Sexual Activity: Not on file   Other Topics Concern  . Not on file   Social History Narrative   Caffeine 1-2 cups daily avg.     Family History  Problem Relation Age of Onset  . Pulmonary Hypertension Mother   . Pulmonary fibrosis Father     Past Medical History  Diagnosis Date  . Tear of meniscus of left knee 12/2014    wearing brace, doing PT    Past Surgical History  Procedure Laterality Date  . Cesarean section    . Tonsillectomy    . Wisdom tooth extraction      Current Outpatient Prescriptions  Medication Sig Dispense Refill  . acetaminophen (TYLENOL) 500 MG tablet Take 1,000 mg by mouth every 6 (six) hours as needed for headache (pain).    . Ascorbic Acid (VITAMIN C PO) Take 1 tablet by mouth at bedtime.    Marland Kitchen aspirin  81 MG chewable tablet Chew 81 mg by mouth at bedtime.     . Cholecalciferol (VITAMIN D3) 5000 UNITS CAPS Take 5,000 Units by mouth at bedtime.    . Coenzyme Q10 (COQ10) 200 MG CAPS Take 200 mg by mouth at bedtime.    . Cyanocobalamin (VITAMIN B-12) 2500 MCG SUBL Place 2,500 mcg under the tongue at bedtime.    Marland Kitchen  doxylamine, Sleep, (UNISOM) 25 MG tablet Take 12.5 mg by mouth at bedtime as needed.     . Magnesium 250 MG TABS Take 250 mg by mouth at bedtime.    . Melatonin 3 MG TABS Take 3 mg by mouth at bedtime.     . Suvorexant (BELSOMRA PO) Take by mouth. Pt given samples 28m, 162m2043mamples to try (by Dr. PerJoylene Draft   . zMarland Kitchenlpidem (AMBIEN) 5 MG tablet Take 1 tablet (5 mg total) by mouth at bedtime as needed for sleep. (Patient not taking: Reported on 05/05/2015) 30 tablet 0   No current facility-administered medications for this visit.    Allergies as of 05/05/2015 - Review Complete 05/05/2015  Allergen Reaction Noted  . Demerol [meperidine] Other (See Comments) 11/28/2013    Vitals: BP 120/70 mmHg  Pulse 72  Resp 18  Ht 5' 8.9" (1.75 m)  Wt 258 lb (117.028 kg)  BMI 38.21 kg/m2 Last Weight:  Wt Readings from Last 1 Encounters:  05/05/15 258 lb (117.028 kg)       Last Height:   Ht Readings from Last 1 Encounters:  05/05/15 5' 8.9" (1.75 m)    Physical exam:  General: The patient is awake, alert and appears not in acute distress. The patient is well groomed. Head: Normocephalic, atraumatic. Neck is supple. Mallampati 4 , invisible uvula  neck circumference: 15.5 . Nasal airflow  unresricted , TMJ is not  evident . Retrognathia is seen.  Cardiovascular:  Regular rate and rhythm , without  murmurs or carotid bruit, and without distended neck veins. Respiratory: Lungs are clear to auscultation. Skin:  Without evidence of edema, or rash Trunk: BMI is  elevated and patient  has normal posture.  Neurologic exam : The patient is awake and alert, oriented to place and time.   Memory  subjective   described as intact. There is a normal attention span & concentration ability.  Speech is fluent without  dysarthria, dysphonia or aphasia. Mood and affect are appropriate.  Cranial nerves: Pupils are equal and briskly reactive to light. Funduscopic exam without  evidence of pallor or edema.  Extraocular movements  in vertical and horizontal planes intact and without nystagmus. Visual fields by finger perimetry are intact. Hearing to finger rub intact.  Facial sensation intact to fine touch. Facial motor strength is symmetric and tongue and uvula move midline.  Motor exam:  Normal tone, muscle bulk and symmetric ,strength in all extremities. Coordination: Rapid alternating movements in the fingers/hands is normal. Finger-to-nose maneuver without evidence of ataxia, dysmetria or tremor. Gait and station: Patient walks without assistive device and is able unassisted to climb up to the exam table.  Strength within normal limits.  She wears a knee brace.  Stance is stable and normal.  Deep tendon reflexes: in the upper and lower extremities are symmetric and downgoing.   Assessment:  After physical and neurologic examination, review of laboratory studies, imaging, neurophysiology testing and pre-existing records, assessment is   Obstructive sleep apnea to a severe degree with a 50.3 AHI. The patient has been doing well with the CPAP machine for compliance is very high. I will follow her compliance yearly but I will change her from a Fisher[employer] E[employer] nasal mask to a dream where mask because she is concerned about the pressure marks and the discomfort she feels with the head gear. Her insomnia has much improved. the patient has now nights without nocturia down from 3-4 bathroom breaks before. She also sleeps through  the night much more soundly.  insomnia is partially due to habits, light exposure and caffeine  and partially due to shift work employment .    Rv in 12 month. DME   [facility] / CC Dr. Cathlean Sauer Graves Nipp MD  05/05/2015

## 2015-05-05 NOTE — Patient Instructions (Signed)

## 2016-05-03 ENCOUNTER — Telehealth: Payer: Self-pay

## 2016-05-03 NOTE — Telephone Encounter (Signed)
Spoke to Randolpharolyn, NP and Dr. Vickey Hugerohmeier. Both agree that this pt is stable and can be seen by the NP. I spoke to pt and she is agreeable to seeing Eber Jonesarolyn, NP 05/03/16 at 3:15 instead of Dr. Vickey Hugerohmeier. Pt verbalized understanding to arrive 15 minutes early to her appt.

## 2016-05-04 ENCOUNTER — Encounter: Payer: Self-pay | Admitting: Nurse Practitioner

## 2016-05-04 ENCOUNTER — Ambulatory Visit (INDEPENDENT_AMBULATORY_CARE_PROVIDER_SITE_OTHER): Payer: BLUE CROSS/BLUE SHIELD | Admitting: Nurse Practitioner

## 2016-05-04 DIAGNOSIS — Z9989 Dependence on other enabling machines and devices: Secondary | ICD-10-CM

## 2016-05-04 DIAGNOSIS — G4733 Obstructive sleep apnea (adult) (pediatric): Secondary | ICD-10-CM | POA: Diagnosis not present

## 2016-05-04 DIAGNOSIS — G4726 Circadian rhythm sleep disorder, shift work type: Secondary | ICD-10-CM

## 2016-05-04 DIAGNOSIS — R0683 Snoring: Secondary | ICD-10-CM | POA: Diagnosis not present

## 2016-05-04 NOTE — Progress Notes (Signed)
[facility]  PATIENT: Shelley Bean DOB: [birth year]   REASON FOR VISIT: Obstructive sleep apnea on CPAP, obesity HISTORY FROM: Patient    HISTORY OF PRESENT ILLNESS: HISTORY CDGina Stephane Bean is a 60 y.o. female seen here as a referral from Dr. Joylene Draft for a sleep consultation.   Shelley Bean reports that she has trouble falling asleep rather than staying asleep. in the morning She has also history of snoring, witnessed by her husband. He has not reported that the patient is apneic but that she seems to snore around 4 AM . The patient relates the changes in her sleep pattern to weight gain, over the last 4-5 years about 45 pounds. She injured her back and was unable to exercise.  As long as she raised her young children she was a less sound sleeper and more in need of being aware of her sleep surrounding. That is no longer the case yet she feels that she has not returned to a sound sleeping pattern. She endorsed the fatigue severity scale at 22 points and the Epworth Sleepiness Scale at 3 points which are not indicative of either fatigue or daytime sleepiness being present. She reports further that she likes to play on her computer or watching movies streaming on her computer and that the blue light or screen light seems to decrease her ability to fall asleep shortly after. Just recently she learned that this may be the cause of some of her sleep problems and she is in the process of implementing changes to her nocturnal habits. Shelley Bean husband has notched her at times to roll over and she will not snore when not sleeping on her back or side, is still while sleeping prone.  She was told that she always breathes through the mouth. She has had a physical exam recently and was told by Dr. Joylene Draft that her upper air way is narrow. 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   The patient has established a pattern that leads her to bed at about 2 AM, about 30 minutes  before she goes to bed she will take Unisom and melatonin. This allows her to fall asleep within about 30 minutes or less. She has been taking these supplements for over 3 years now. While on vacation once she forgot these supplements and was unable to sleep.  She is usually able to sleep through the night when she takes her over-the-counter supplements, and rises at about 7 AM and often feels in need of a nap until 10 AM , feeling groggy.  She does not take any other daytime naps or afternoon naps. She will get about 5 hours of nocturnal sleep another 2 hours in the morning. She drinks 5 cups of coffee up to 6 PM, she is now aware of this being a possible insomnia contributor.   Dr. Joylene Draft recently provided her with samples of Belsomra and the 5, 10 and 15 mg samples did not lead to the desired effect of falling asleep easier and on the contrary she awoke at 5 in the morning, wide awake.  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 The patient's husband Dr. Malva Cogan is a orthopedic surgeon was seems to do fine on 6 hours. The patient's father had witnessed snoring and apnea.    05-05-15 CDMrs. Stodghill is here for his first follow-up after her split night polysomnography which performed on 03-01-15. The patient has a very high RDI and AHI  of 50.3 REM sleep was not seen and she spent none of her sleep in supine position. She had some periodic limb movements but none let to arousals her heart rate was stable she had isolated ectopies. After CPAP was initiated it was advanced to 9 cm water which she seemed to tolerate well and nasal mask by [employer] in medium size was used. The patient has been slowly getting adjusted to it but she said it was a challenge because she prefers to sleep problem and this is not possible with her headgear on. She was rather surprised that she would have that severe degree of sleep apnea ring was also noted. She wakes up with impressions on her  face from the nasal mask and she would probably prefer a pillow. Interested in a dream where mask and I can certainly change her prescription to that. We also met today to look at her download. She used the machine 100% of the time for the last 30 days and 29 days over 4 hours which makes and 97% compliant a good results. On average she sleeps 6 hours 35 minutes with the CPAP machine set at 9 cm water pressure with 3 cm EPR. She does have an AHI of 3.1. She does not use the humidifier.   UPDATE 05/17/2017CM Shelley Bean, 60 year old female returns for follow-up. She has a history of obstructive sleep apnea and is currently on CPAP. Compliance report today he has 100% usage for 30 days 04/04/2016 to 05/03/2016. Average usage 6 hours 57 minutes. Set pressure 9 cm. AHI 1.9. Minimal leaks (3)the patient says sometimes her mask wakes her up. ESS score today 2, FFS 19. She returns for reevaluation   REVIEW OF SYSTEMS: Full 14 system review of systems performed and notable only for those listed, all others are neg:  Constitutional: neg  Cardiovascular: neg Ear/Nose/Throat: neg  Skin: neg Eyes: neg Respiratory: neg Gastroitestinal: neg  Hematology/Lymphatic: neg  Endocrine: neg Musculoskeletal:neg Allergy/Immunology: neg Neurological: neg Psychiatric: neg Sleep : neg   ALLERGIES: Allergies  Allergen Reactions  . Demerol [Meperidine] Other (See Comments)    paradoxical reaction (makes her hyper)    HOME MEDICATIONS: Outpatient Prescriptions Prior to Visit  Medication Sig Dispense Refill  . acetaminophen (TYLENOL) 500 MG tablet Take 1,000 mg by mouth every 6 (six) hours as needed for headache (pain).    . Ascorbic Acid (VITAMIN C PO) Take 1 tablet by mouth at bedtime.    Marland Kitchen aspirin 81 MG chewable tablet Chew 81 mg by mouth at bedtime.     . Cholecalciferol (VITAMIN D3) 5000 UNITS CAPS Take 5,000 Units by mouth at bedtime.    . Coenzyme Q10 (COQ10) 200 MG CAPS Take 200 mg by mouth at bedtime.      . Cyanocobalamin (VITAMIN B-12) 2500 MCG SUBL Place 2,500 mcg under the tongue at bedtime.    Marland Kitchen doxylamine, Sleep, (UNISOM) 25 MG tablet Take 12.5 mg by mouth at bedtime as needed.     . Magnesium 250 MG TABS Take 250 mg by mouth at bedtime.    . Melatonin 3 MG TABS Take 3 mg by mouth at bedtime.     . Suvorexant (BELSOMRA PO) Take by mouth. Pt given samples 61m, 133m2083mamples to try (by Dr. PerJoylene Draft   . zMarland Kitchenlpidem (AMBIEN) 5 MG tablet Take 1 tablet (5 mg total) by mouth at bedtime as needed for sleep. (Patient not taking: Reported on 05/05/2015) 30 tablet 0   No facility-administered  medications prior to visit.    PAST MEDICAL HISTORY: Past Medical History  Diagnosis Date  . Tear of meniscus of left knee 12/2014    wearing brace, doing PT    PAST SURGICAL HISTORY: Past Surgical History  Procedure Laterality Date  . Cesarean section    . Tonsillectomy    . Wisdom tooth extraction      FAMILY HISTORY: Family History  Problem Relation Age of Onset  . Pulmonary Hypertension Mother   . Pulmonary fibrosis Father     SOCIAL HISTORY: Social History   Social History  . Marital Status: Married    Spouse Name: N/A  . Number of Children: 3  . Years of Education: RN   Occupational History  . Not on file.   Social History Main Topics  . Smoking status: Never Smoker   . Smokeless tobacco: Not on file  . Alcohol Use: 0.0 oz/week    0 Standard drinks or equivalent per week     Comment: socialholidays  . Drug Use: No  . Sexual Activity: Not on file   Other Topics Concern  . Not on file   Social History Narrative   Caffeine 1-2 cups daily avg.      PHYSICAL EXAM  Filed Vitals:   05/04/16 1523  BP: 120/75  Pulse: 75  Height: '5\' 8"'  (1.727 m)  Weight: 223 lb 3.2 oz (101.243 kg)   Body mass index is 33.95 kg/(m^2).  Generalized: Well developed, Obese female in no acute distress  Head: normocephalic and atraumatic,. Oropharynx benign mallopatti 4.  Neck: Supple,  no carotid bruits neck circumference 15.5 Cardiac: Regular rate rhythm, no murmur  Musculoskeletal: No deformity   Neurological examination   Mentation: Alert oriented to time, place, history taking. Attention span and concentration appropriate. Recent and remote memory intact.  Follows all commands speech and language fluent. ESS 2 FSS 19. Cranial nerve II-XII: Pupils were equal round reactive to light extraocular movements were full, visual field were full on confrontational test. Facial sensation and strength were normal. hearing was intact to finger rubbing bilaterally. Uvula tongue midline. head turning and shoulder shrug were normal and symmetric.Tongue protrusion into cheek strength was normal. Motor: normal bulk and tone, full strength in the BUE, BLE, fine finger movements normal, no pronator drift. No focal weakness Coordination: finger-nose-finger, heel-to-shin bilaterally, no dysmetria Reflexes: Symmetric upper and lower plantar responses were flexor bilaterally. Gait and Station: Rising up from seated position without assistance, normal stance,  moderate stride, good arm swing, smooth turning, able to perform tiptoe, and heel walking without difficulty. Tandem gait is steady  DIAGNOSTIC DATA (LABS, IMAGING, TESTING) - ASSESSMENT AND PLAN  60 y.o. year old female  has a past medical history of obstructive sleep apnea with CPAP.Compliance report today he has 100% usage for 30 days 04/04/2016 to 05/03/2016. Average usage 6 hours 57 minutes. Set pressure 9 cm. AHI 1.9. Minimal leaks the patient says sometimes her mask wakes her up.  PLAN: CPAP compliance good Stop by sleep lab on the way out to make appt for mask fit F/U yearly Dennie Bible, [facility], [facility], [facility] [address] [place], [place] [address] [phone]

## 2016-05-04 NOTE — Patient Instructions (Signed)
CPAP compliance good Stop by sleep lab on the way out to make appt for mask fit F/U yearly

## 2016-05-05 NOTE — Progress Notes (Signed)
I agree with the assessment and plan as directed by NP .The patient is known to me .   Amarah Brossman, MD  

## 2017-01-14 ENCOUNTER — Encounter (HOSPITAL_COMMUNITY): Payer: Self-pay | Admitting: Emergency Medicine

## 2017-01-14 ENCOUNTER — Emergency Department (HOSPITAL_COMMUNITY): Payer: BLUE CROSS/BLUE SHIELD

## 2017-01-14 ENCOUNTER — Emergency Department (HOSPITAL_COMMUNITY)
Admission: EM | Admit: 2017-01-14 | Discharge: 2017-01-14 | Disposition: A | Discharge status: Home / Self Care | Payer: BLUE CROSS/BLUE SHIELD | Attending: Emergency Medicine | Admitting: Emergency Medicine

## 2017-01-14 DIAGNOSIS — R55 Syncope and collapse: Secondary | ICD-10-CM | POA: Insufficient documentation

## 2017-01-14 DIAGNOSIS — Z7982 Long term (current) use of aspirin: Secondary | ICD-10-CM | POA: Insufficient documentation

## 2017-01-14 LAB — URINALYSIS, ROUTINE W REFLEX MICROSCOPIC
Bilirubin Urine: NEGATIVE
GLUCOSE, UA: NEGATIVE mg/dL
HGB URINE DIPSTICK: NEGATIVE
Ketones, ur: 20 mg/dL — AB
LEUKOCYTES UA: NEGATIVE
Nitrite: NEGATIVE
Protein, ur: NEGATIVE mg/dL
Specific Gravity, Urine: 1.016 (ref 1.005–1.030)
pH: 7 (ref 5.0–8.0)

## 2017-01-14 LAB — BASIC METABOLIC PANEL
Anion gap: 10 (ref 5–15)
BUN: 14 mg/dL (ref 6–20)
CHLORIDE: 102 mmol/L (ref 101–111)
CO2: 26 mmol/L (ref 22–32)
Calcium: 9.4 mg/dL (ref 8.9–10.3)
Creatinine, Ser: 0.84 mg/dL (ref 0.44–1.00)
GFR calc non Af Amer: >60 mL/min (ref >60)
Glucose, Bld: 127 mg/dL — ABNORMAL HIGH (ref 65–99)
Potassium: 4 mmol/L (ref 3.5–5.1)
Sodium: 138 mmol/L (ref 135–145)

## 2017-01-14 LAB — CBC
HCT: 45.4 % (ref 36.0–46.0)
Hemoglobin: 15.5 g/dL — ABNORMAL HIGH (ref 12.0–15.0)
MCH: 30.8 pg (ref 26.0–34.0)
MCHC: 34.1 g/dL (ref 30.0–36.0)
MCV: 90.1 fL (ref 78.0–100.0)
Platelets: 194 10*3/uL (ref 150–400)
RBC: 5.04 MIL/uL (ref 3.87–5.11)
RDW: 12.5 % (ref 11.5–15.5)
WBC: 9.5 10*3/uL (ref 4.0–10.5)

## 2017-01-14 LAB — CBG MONITORING, ED: GLUCOSE-CAPILLARY: 116 mg/dL — AB (ref 65–99)

## 2017-01-14 LAB — TROPONIN I: Troponin I: <0.03 ng/mL (ref <0.03)

## 2017-01-14 MED ORDER — ACETAMINOPHEN 500 MG PO TABS
500.0000 mg | ORAL_TABLET | Freq: Once | ORAL | Status: AC
  Administered 2017-01-14: 500 mg via ORAL
  Filled 2017-01-14: qty 1

## 2017-01-14 MED ORDER — SODIUM CHLORIDE 0.9 % IV BOLUS (SEPSIS)
1000.0000 mL | Freq: Once | INTRAVENOUS | Status: AC
  Administered 2017-01-14: 1000 mL via INTRAVENOUS

## 2017-01-14 MED ORDER — ONDANSETRON HCL 4 MG/2ML IJ SOLN
4.0000 mg | Freq: Once | INTRAMUSCULAR | Status: AC
  Administered 2017-01-14: 4 mg via INTRAVENOUS
  Filled 2017-01-14: qty 2

## 2017-01-14 NOTE — ED Notes (Signed)
Patient transported to CT 

## 2017-01-14 NOTE — ED Triage Notes (Signed)
Pt BIB EMS from home where pt had witnessed syncopal episode in her chair, pt LOC 30 seconds when pt husband lowered her onto the floor. Per pt husband pt has hx of similar episodes. Pt A&O x 4; resp u/u; nad noted at this time.

## 2017-01-14 NOTE — ED Notes (Signed)
Pt's CBG result: 116, informed Jessica-RN.

## 2017-01-14 NOTE — ED Notes (Signed)
Pt ambulated around room with standby assist. Pt states " I feel a little dizzy but pretty much back to normal."

## 2017-01-14 NOTE — ED Provider Notes (Signed)
[facility]-EMERGENCY DEPT Provider Note   CSN: [id number] Arrival date & time: 01/14/17  1128     History   Chief Complaint Chief Complaint  Patient presents with  . Loss of Consciousness    HPI Shelley Bean is a 61 y.o. female.  HPI Patient presents with syncopal episode. 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 Yesterday had her flu and zoster shot. Have been complaining of some pain in her arms were the shots were. Also has had a headache for last couple days. Today told her husband she felt as if she was going to pass out. She then did pass out. Reportedly was unresponsive for like 30 minutes and his been somewhat slow to respond since. Vomited twice while EMS was attempting orthostatic vital signs. Reportedly was not orthostatic. She has had some previous episodes of syncope. One around 15 years ago was extensively worked up in [place]. No clear cause found at that time. Also had a similar episode while she was on a cruise in September. Reportedly had IV fluids and felt much better. No chest pain. No fevers or chills. Has had some myalgias. No swelling in her arms or legs. She is on a ketogenic diet. Past Medical History:  Diagnosis Date  . Tear of meniscus of left knee 12/2014   wearing brace, doing PT    Patient Active Problem List   Diagnosis Date Noted  . OSA on CPAP 05/04/2016  . Shifting sleep-work schedule, affecting sleep 03/11/2015  . Obesity, morbid (HCC) 03/11/2015  . Insomnia due to stress 01/13/2015  . Severe obesity (BMI >= 40) (HCC) 01/13/2015  . Snoring 01/13/2015    Past Surgical History:  Procedure Laterality Date  . CESAREAN SECTION    . TONSILLECTOMY    . WISDOM TOOTH EXTRACTION      OB History    No data available       Home Medications    Prior to Admission medications   Medication Sig Start Date End Date Taking? Authorizing Provider  acetaminophen (TYLENOL) 500 MG tablet Take 1,000 mg by mouth every 6 (six) hours as  needed for headache (pain).   Yes Historical Provider, MD  aspirin 81 MG chewable tablet Chew 81 mg by mouth at bedtime.    Yes Historical Provider, MD  Cholecalciferol (VITAMIN D3) 5000 UNITS CAPS Take 5,000 Units by mouth at bedtime.   Yes Historical Provider, MD  Coenzyme Q10 (COQ10) 200 MG CAPS Take 200 mg by mouth at bedtime.   Yes Historical Provider, MD  Cyanocobalamin (VITAMIN B-12) 2500 MCG SUBL Place 2,500 mcg under the tongue at bedtime.   Yes Historical Provider, MD  doxylamine, Sleep, (UNISOM) 25 MG tablet Take 12.5 mg by mouth at bedtime as needed.    Yes Historical Provider, MD  Magnesium 250 MG TABS Take 250 mg by mouth at bedtime.   Yes Historical Provider, MD  Melatonin 3 MG TABS Take 3 mg by mouth at bedtime.    Yes Historical Provider, MD  Ascorbic Acid (VITAMIN C PO) Take 1 tablet by mouth at bedtime.    Historical Provider, MD    Family History Family History  Problem Relation Age of Onset  . Pulmonary fibrosis Father   . Pulmonary Hypertension Mother     Social History Social History  Substance Use Topics  . Smoking status: Never Smoker  . Smokeless tobacco: Not on file  . Alcohol use 0.0 oz/week     Comment: socialholidays  Allergies   Demerol [meperidine]   Review of Systems Review of Systems  Constitutional: Negative for appetite change.  HENT: Negative for congestion.   Eyes: Negative for photophobia.  Respiratory: Negative for shortness of breath.   Cardiovascular: Negative for chest pain.  Gastrointestinal: Negative for abdominal pain.  Genitourinary: Negative for dysuria.  Musculoskeletal: Positive for myalgias. Negative for back pain.  Skin: Negative for pallor and rash.  Neurological: Positive for syncope and headaches.  Hematological: Negative for adenopathy.  Psychiatric/Behavioral: Negative for confusion.     Physical Exam Updated Vital Signs BP (!) 109/52   Pulse 85   Temp 98.3 F (36.8 C) (Oral)   Resp 11   Ht 5\' 8"   (1.727 m)   Wt 195 lb (88.5 kg)   SpO2 96%   BMI 29.65 kg/m   Physical Exam  Constitutional: She appears well-developed.  HENT:  Patient laying back in her bed with her eyes closed.  Eyes: EOM are normal. Pupils are equal, round, and reactive to light.  Neck: Neck supple.  Cardiovascular: Normal rate.   Pulmonary/Chest: Effort normal.  Abdominal: Soft.  Musculoskeletal: Normal range of motion.  Neurological: She is alert.  Skin: Skin is warm. Capillary refill takes less than 2 seconds.     ED Treatments / Results  Labs (all labs ordered are listed, but only abnormal results are displayed) Labs Reviewed  BASIC METABOLIC PANEL - Abnormal; Notable for the following:       Result Value   Glucose, Bld 127 (*)    All other components within normal limits  CBC - Abnormal; Notable for the following:    Hemoglobin 15.5 (*)    All other components within normal limits  URINALYSIS, ROUTINE W REFLEX MICROSCOPIC - Abnormal; Notable for the following:    Ketones, ur 20 (*)    All other components within normal limits  CBG MONITORING, ED - Abnormal; Notable for the following:    Glucose-Capillary 116 (*)    All other components within normal limits  TROPONIN I    EKG  EKG Interpretation  Date/Time:  Saturday January 14 2017 11:35:38 EST Ventricular Rate:  90 PR Interval:    QRS Duration: 111 QT Interval:  385 QTC Calculation: 472 R Axis:   88 Text Interpretation:  Sinus rhythm Probable left atrial enlargement Borderline right axis deviation Confirmed by Rubin PayorPICKERING  MD, Marzelle Rutten [phone]([id number]) on 01/14/2017 4:41:57 PM       Radiology Ct Head Wo Contrast  Result Date: 01/14/2017 CLINICAL DATA:  Syncope, headaches EXAM: CT HEAD WITHOUT CONTRAST TECHNIQUE: Contiguous axial images were obtained from the base of the skull through the vertex without intravenous contrast. COMPARISON:  None. FINDINGS: Brain: No acute intracranial abnormality. Specifically, no hemorrhage, hydrocephalus, mass  lesion, acute infarction, or significant intracranial injury. Vascular: No hyperdense vessel or unexpected calcification. Skull: No acute calvarial abnormality. Sinuses/Orbits: Visualized paranasal sinuses and mastoids clear. Orbital soft tissues unremarkable. Other: None IMPRESSION: No intracranial abnormality. Electronically Signed   By: Charlett NoseKevin  Dover M.D.   On: 01/14/2017 13:05   Dg Chest Portable 1 View  Result Date: 01/14/2017 CLINICAL DATA:  Syncope and loss of consciousness. EXAM: PORTABLE CHEST 1 VIEW COMPARISON:  11/09/2014 and prior chest radiographs FINDINGS: The cardiomediastinal silhouette is unremarkable. There is no evidence of focal airspace disease, pulmonary edema, suspicious pulmonary nodule/mass, pleural effusion, or pneumothorax. No acute bony abnormalities are identified. IMPRESSION: No active disease. Electronically Signed   By: Harmon PierJeffrey  Hu M.D.   On: 01/14/2017 12:30  Procedures Procedures (including critical care time)  Medications Ordered in ED Medications  sodium chloride 0.9 % bolus 1,000 mL (0 mLs Intravenous Stopped 01/14/17 1318)  ondansetron (ZOFRAN) injection 4 mg (4 mg Intravenous Given 01/14/17 1213)  acetaminophen (TYLENOL) tablet 500 mg (500 mg Oral Given 01/14/17 1316)  sodium chloride 0.9 % bolus 1,000 mL (0 mLs Intravenous Stopped 01/14/17 1612)     Initial Impression / Assessment and Plan / ED Course  I have reviewed the triage vital signs and the nursing notes.  Pertinent labs & imaging results that were available during my care of the patient were reviewed by me and considered in my medical decision making (see chart for details).     Patient was syncope. History same without clear cause. Labs and EKG reassuring. She she feels better after fluids both IV and oral. Will discharge home. Will follow-up with her PCP.     Final Clinical Impressions(s) / ED Diagnoses   Final diagnoses:  Syncope, unspecified syncope type    New Prescriptions New  Prescriptions   No medications on file     Benjiman Core, MD 01/14/17 1642

## 2017-01-14 NOTE — ED Notes (Signed)
Pt tried to use bathroom for urine sample, pt unable to go at this time.

## 2017-05-01 ENCOUNTER — Telehealth: Payer: Self-pay

## 2017-05-01 NOTE — Telephone Encounter (Signed)
Called pt to r/s Thursday's appt per provider request. However, pt decided to cancel appt at this time d/t upcoming business meetings and other stuff going on. Reports that she's currently doing well but will call back to r/s if needed.

## 2017-05-04 ENCOUNTER — Ambulatory Visit: Payer: BLUE CROSS/BLUE SHIELD | Admitting: Nurse Practitioner

## 2017-11-16 ENCOUNTER — Telehealth: Payer: Self-pay | Admitting: Neurology

## 2017-11-16 NOTE — Telephone Encounter (Signed)
Received this notice from [employer]: "I just got a Voicemail from her and Ladona Ridgelaylor is going to call her back very soon. Thank you "

## 2017-11-16 NOTE — Telephone Encounter (Signed)
Pt called she is wanting to know if she can order CPAP Air mini and Socap cleaner or does she need an appt. [facility] and was told she will probably need it to be ordered. I advised the pt she should make an appt but she refused until she spoke with RN. Please call to advise

## 2017-11-16 NOTE — Telephone Encounter (Signed)
I called pt, I advised her that before any orders are generated from this office, she will need an appt with Dr. Vickey Hugerohmeier or the NP. Pt is asking what she will need to get a so clean machine or a mini cpap. I advised her that since insurance does not cover so clean machines, she most likely will not need an order for this, but that the mini cpap may need an order. She reports that she has called [facility] but no one has called her back. I advised her that I would reach out to [employer] on her behalf and ask them to call her to discuss what is needed. I reminded pt that if she needs orders from us for any of these things, she will need an appt. Pt verbalized understanding.  I have reached out to [employer].

## 2018-02-21 ENCOUNTER — Telehealth: Payer: Self-pay | Admitting: Nurse Practitioner

## 2018-02-21 NOTE — Telephone Encounter (Signed)
Pt called stating her insurance is needing a letter confirming she had the CPAP prescribed to her. Pt requesting we mail the letter to her. Any question contact pt at  [phone]

## 2018-02-22 ENCOUNTER — Encounter: Payer: Self-pay | Admitting: Neurology

## 2018-02-22 NOTE — Telephone Encounter (Signed)
Letter written and sent to her mychart as well as will be placed in the mail. I will also send a copy of her sleep study that was completed in 2016.

## 2018-09-12 ENCOUNTER — Telehealth: Payer: Self-pay | Admitting: Nurse Practitioner

## 2018-09-12 NOTE — Telephone Encounter (Signed)
Called the patient back and advised that I had gotten her message. Patient states that she has been having difficulty with sleeping during the night. Advised the patient that we havent seen her since 2017 and it would be best to evaluate and address her CPAP compliance status and her discuss whether a new sleep study is needed. I was able to offer the patient a 8:30 am slot on 10/1. Advised the pt to be here between 8 and 8:15 am for check in. Pt verbalized understanding.

## 2018-09-12 NOTE — Telephone Encounter (Signed)
Pt requesting a call back, stating she had a sleep study around 4 to 5 years ago and would like to discus having a new one done. Stating she has been waking up more frequently during the night. Aware the she will need to schedule a yearly follow up but wished to discuss prior

## 2018-09-18 ENCOUNTER — Ambulatory Visit: Payer: Self-pay | Admitting: Neurology

## 2018-09-18 ENCOUNTER — Telehealth: Payer: Self-pay

## 2018-09-18 NOTE — Telephone Encounter (Signed)
Patient no showed her OV with Dr. Vickey Huger today.

## 2018-09-19 ENCOUNTER — Encounter: Payer: Self-pay | Admitting: Neurology

## 2018-09-19 ENCOUNTER — Ambulatory Visit: Payer: PRIVATE HEALTH INSURANCE | Admitting: Neurology

## 2018-09-19 VITALS — BP 138/84 | HR 77 | Ht 68.0 in | Wt 239.0 lb

## 2018-09-19 DIAGNOSIS — R0683 Snoring: Secondary | ICD-10-CM | POA: Diagnosis not present

## 2018-09-19 DIAGNOSIS — G4733 Obstructive sleep apnea (adult) (pediatric): Secondary | ICD-10-CM

## 2018-09-19 DIAGNOSIS — F5104 Psychophysiologic insomnia: Secondary | ICD-10-CM | POA: Diagnosis not present

## 2018-09-19 DIAGNOSIS — Z9989 Dependence on other enabling machines and devices: Secondary | ICD-10-CM | POA: Diagnosis not present

## 2018-09-19 MED ORDER — TRAZODONE HCL 50 MG PO TABS: 25.0000 mg | ORAL_TABLET | Freq: Every evening | ORAL | 0 refills | Status: DC | PRN

## 2018-09-19 NOTE — Patient Instructions (Addendum)
Please remember to try to maintain good sleep hygiene, which means: Keep a regular sleep and wake schedule, try not to exercise or have a meal within 2 hours of your bedtime, try to keep your bedroom conducive for sleep, that is, cool and dark, without light distractors such as an illuminated alarm clock, and refrain from watching TV right before sleep or in the middle of the night and do not keep the TV or radio on during the night. Also, try not to use or play on electronic devices at bedtime, such as your cell phone, tablet PC or laptop. If you like to read at bedtime on an electronic device, try to dim the background light as much as possible. Do not eat in the middle of the night.     For chronic insomnia, you are best followed by a psychiatrist and/or sleep psychologist.    Trazodone tablets What is this medicine? TRAZODONE (TRAZ oh done) is used to treat depression. This medicine may be used for other purposes; ask your health care provider or pharmacist if you have questions. COMMON BRAND NAME(S): Desyrel What should I tell my health care provider before I take this medicine? They need to know if you have any of these conditions: -attempted suicide or thinking about it -bipolar disorder -bleeding problems -glaucoma -heart disease, or previous heart attack -irregular heart beat -kidney or liver disease -low levels of sodium in the blood -an unusual or allergic reaction to trazodone, other medicines, foods, dyes or preservatives -pregnant or trying to get pregnant -breast-feeding How should I use this medicine? Take this medicine by mouth with a glass of water. Follow the directions on the prescription label. Take this medicine shortly after a meal or a light snack. Take your medicine at regular intervals. Do not take your medicine more often than directed. Do not stop taking this medicine suddenly except upon the advice of your doctor. Stopping this medicine too quickly may cause  serious side effects or your condition may worsen. A special MedGuide will be given to you by the pharmacist with each prescription and refill. Be sure to read this information carefully each time. Talk to your pediatrician regarding the use of this medicine in children. Special care may be needed. Overdosage: If you think you have taken too much of this medicine contact a poison control center or emergency room at once. NOTE: This medicine is only for you. Do not share this medicine with others. What if I miss a dose? If you miss a dose, take it as soon as you can. If it is almost time for your next dose, take only that dose. Do not take double or extra doses. What may interact with this medicine? Do not take this medicine with any of the following medications: -certain medicines for fungal infections like fluconazole, itraconazole, ketoconazole, posaconazole, voriconazole -cisapride -dofetilide -dronedarone -linezolid -MAOIs like Carbex, Eldepryl, Marplan, Nardil, and Parnate -mesoridazine -methylene blue (injected into a vein) -pimozide -saquinavir -thioridazine -ziprasidone This medicine may also interact with the following medications: -alcohol -antiviral medicines for HIV or AIDS -aspirin and aspirin-like medicines -barbiturates like phenobarbital -certain medicines for blood pressure, heart disease, irregular heart beat -certain medicines for depression, anxiety, or psychotic disturbances -certain medicines for migraine headache like almotriptan, eletriptan, frovatriptan, naratriptan, rizatriptan, sumatriptan, zolmitriptan -certain medicines for seizures like carbamazepine and phenytoin -certain medicines for sleep -certain medicines that treat or prevent blood clots like dalteparin, enoxaparin, warfarin -digoxin -fentanyl -lithium -NSAIDS, medicines for pain and inflammation, like ibuprofen  or naproxen -other medicines that prolong the QT interval (cause an abnormal heart  rhythm) -rasagiline -supplements like St. John's wort, kava kava, valerian -tramadol -tryptophan This list may not describe all possible interactions. Give your health care provider a list of all the medicines, herbs, non-prescription drugs, or dietary supplements you use. Also tell them if you smoke, drink alcohol, or use illegal drugs. Some items may interact with your medicine. What should I watch for while using this medicine? Tell your doctor if your symptoms do not get better or if they get worse. Visit your doctor or health care professional for regular checks on your progress. Because it may take several weeks to see the full effects of this medicine, it is important to continue your treatment as prescribed by your doctor. Patients and their families should watch out for new or worsening thoughts of suicide or depression. Also watch out for sudden changes in feelings such as feeling anxious, agitated, panicky, irritable, hostile, aggressive, impulsive, severely restless, overly excited and hyperactive, or not being able to sleep. If this happens, especially at the beginning of treatment or after a change in dose, call your health care professional. Bonita Quin may get drowsy or dizzy. Do not drive, use machinery, or do anything that needs mental alertness until you know how this medicine affects you. Do not stand or sit up quickly, especially if you are an older patient. This reduces the risk of dizzy or fainting spells. Alcohol may interfere with the effect of this medicine. Avoid alcoholic drinks. This medicine may cause dry eyes and blurred vision. If you wear contact lenses you may feel some discomfort. Lubricating drops may help. See your eye doctor if the problem does not go away or is severe. Your mouth may get dry. Chewing sugarless gum, sucking hard candy and drinking plenty of water may help. Contact your doctor if the problem does not go away or is severe. What side effects may I notice from  receiving this medicine? Side effects that you should report to your doctor or health care professional as soon as possible: -allergic reactions like skin rash, itching or hives, swelling of the face, lips, or tongue -elevated mood, decreased need for sleep, racing thoughts, impulsive behavior -confusion -fast, irregular heartbeat -feeling faint or lightheaded, falls -feeling agitated, angry, or irritable -loss of balance or coordination -painful or prolonged erections -restlessness, pacing, inability to keep still -suicidal thoughts or other mood changes -tremors -trouble sleeping -seizures -unusual bleeding or bruising Side effects that usually do not require medical attention (report to your doctor or health care professional if they continue or are bothersome): -change in sex drive or performance -change in appetite or weight -constipation -headache -muscle aches or pains -nausea This list may not describe all possible side effects. Call your doctor for medical advice about side effects. You may report side effects to FDA at 1-800-FDA-1088. Where should I keep my medicine? Keep out of the reach of children. Store at room temperature between 15 and 30 degrees C (59 to 86 degrees F). Protect from light. Keep container tightly closed. Throw away any unused medicine after the expiration date. NOTE: This sheet is a summary. It may not cover all possible information. If you have questions about this medicine, talk to your doctor, pharmacist, or health care provider.  2018 Elsevier/Gold Standard (2016-05-05 16:57:05)

## 2018-09-19 NOTE — Progress Notes (Signed)
SLEEP MEDICINE CLINIC   Provider:  Larey Seat, M D  Referring Provider: Crist Infante, MD Primary Care Physician:  Crist Infante, MD  Chief Complaint  Patient presents with  . Follow-up    Room 11. Patient is alone. She reports that she is waking up a lot more than she used. to.     HPI:  Shelley Bean is a 62 y.o. female seen here as a revisit from Dr. Joylene Draft for a sleep consultation.    Mrs Klatt has been seen in our sleep clinic for many years, and has been a compliant CPAP user. She developed sleepiness again over the last year , feeling exhausted but not able to get the best quality sleep. She has trouble to initiate sleep, often stares at the ceiling for 2 hours or longer. She has trouble to get comfortable, had a herniated disc- did not need surgery. She looks for answers. She has gained some weight- was 2 years ago at 210, now gained 25 pounds. Mrs. Robbins compliance to CPAP is excellent, she has used the machine 100% by time with an average time of nightly use of 7 hours and 4 minutes, CPAP is an auto sense air sense 10 with a set pressure of 9 cmH2O 3 cm EPR and a residual AHI of 2.8.  Of these residual apneas 2.1) per hour are obstructive in nature which means that we should probably increase her pressure by a centimeter at least.  95th percentile air leak was 4.2 L overall however her apnea is very well controlled and as such as that before she is highly compliant.  She wears with a dry mouth, is still snoring.  She is now using a DreamWear mask and finds this quite comfortable. Most mornings she awakens with a smiley face.  She endorsed the fatigue severity questionnaire today at 23 out of 63 points the Epworth Sleepiness Scale at only 1 out of 24 possible points.  Her chief complaint is insomnia. She has reduced coffee to cups a day, no sodas, no tea. Has been on a keto diet.  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  She is often sweating at night- She uses a blue light filter on her smart phone and has a clock in sight- we should eliminate these.     Consult Note - CD  01-13-2015  Mrs. Muhs reports that she has trouble falling asleep rather than staying asleep. She has also history of snoring, witnessed by her husband. He has not reported that the patient is apneic but that she seems to snore around 4 AM . The patient relates the changes in her sleep pattern to weight gain, over the last 4-5 years about 45 pounds.  She injured her back and was unable to exercise.  As long as she raised her young children she was a less sound sleeper and more in need of being aware of her sleep surrounding. That is no longer the case yet she feels that she has not returned to a sound sleeping pattern. She endorsed the fatigue severity scale at 22 points and the Epworth Sleepiness Scale at 3 points which are not indicative of either fatigue or daytime sleepiness being present. She reports further that she likes to play on her computer or watching movies streaming on her computer and that the blue light or screen light seems to decrease her ability to fall asleep shortly after. Just recently  she learned that this may be the cause of some of her sleep problems and she is in the process of implementing changes to her nocturnal habits. [name]  husband has notched her at times to roll over and she will not snore when not sleeping on her back or side, is still while sleeping prone.  She was told that she always breathes through the mouth. She has had a physical exam recently and was told by Dr. Joylene Draft that her upper air way is narrow. 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   The patient has established a pattern that leads her to bed at about 2 AM, about 30 minutes before she goes to bed she will take Unisom and melatonin. This allows her to fall asleep within about 30 minutes or less. She has been taking these supplements  for over 3 years now. While on vacation once she  forgot these supplements and was unable to sleep. She is usually able to sleep through the night when she takes her over-the-counter supplements, and rises at about 7 AM and often feels in need of a nap until 10 AM , feeling groggy. She does not take any other daytime naps or afternoon naps. She will get about 5 hours of nocturnal sleep another 2 hours in the morning. She drinks 5 cups of coffee up to 6 PM, she is now aware of this being a possible insomnia contributor.   Dr. Joylene Draft recently provided her with samples of Belsomra and the 5, 10 and 15 mg samples did not lead to the desired effect of falling asleep easier and on the contrary she awoke at 5 in the morning, wide awake.  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 The patient's husband Dr. Malva Cogan is a orthopedic surgeon was seems to do fine on 6 hours. The patient's father had witnessed snoring and apnea.   History from 05-05-15 Mrs. Portillo is here for his first follow-up after her split night polysomnography which performed on 03-01-15. The patient has a very high RDI and AHI of 50.3 REM sleep was not seen and she spent none of her sleep in supine position. She had some periodic limb movements but none let to arousals her heart rate was stable she had isolated ectopies. After CPAP was initiated it was advanced to 9 cm water which she seemed to tolerate well and nasal mask by [employer] in medium size was used. The patient has been slowly getting adjusted to it but she said it was a challenge because she prefers to sleep problem and this is not possible with her headgear on. She was rather surprised that she would have that severe degree of sleep apnea ring was also noted. She wakes up with impressions on her face from the nasal mask and she would probably prefer a pillow. Interested in a dream where mask and I can certainly change her prescription to that. We  also met today to look at her download. She used the machine 100% of the time for the last 30 days and 29 days over 4 hours which makes and 97% compliant a good results. On average she sleeps 6 hours 35 minutes with the CPAP machine set at 9 cm water pressure with 3 cm EPR. She does have an AHI of 3.1. She does not use the humidifier.     Review of Systems: Out of a complete 14 system review, the patient complains of only  the following symptoms, and all other reviewed systems are negative. Snoring, weight gain, no exercise.  Unable to exercise.  Epworth score  1 , Fatigue severity score 11  , depression score n/a .   Social History   Socioeconomic History  . Marital status: Married    Spouse name: Not on file  . Number of children: 3  . Years of education: Therapist, sports  . Highest education level: Not on file  Occupational History  . Not on file  Social Needs  . Financial resource strain: Not on file  . Food insecurity:    Worry: Not on file    Inability: Not on file  . Transportation needs:    Medical: Not on file    Non-medical: Not on file  Tobacco Use  . Smoking status: Never Smoker  . Smokeless tobacco: Never Used  Substance and Sexual Activity  . Alcohol use: Yes    Alcohol/week: 0.0 standard drinks    Comment: socialholidays  . Drug use: No  . Sexual activity: Not on file  Lifestyle  . Physical activity:    Days per week: Not on file    Minutes per session: Not on file  . Stress: Not on file  Relationships  . Social connections:    Talks on phone: Not on file    Gets together: Not on file    Attends religious service: Not on file    Active member of club or organization: Not on file    Attends meetings of clubs or organizations: Not on file    Relationship status: Not on file  . Intimate partner violence:    Fear of current or ex partner: Not on file    Emotionally abused: Not on file    Physically abused: Not on file    Forced sexual activity: Not on file  Other  Topics Concern  . Not on file  Social History Narrative   Caffeine 1-2 cups daily avg.     Family History  Problem Relation Age of Onset  . Pulmonary fibrosis Father   . Pulmonary Hypertension Mother     Past Medical History:  Diagnosis Date  . Tear of meniscus of left knee 12/2014   wearing brace, doing PT    Past Surgical History:  Procedure Laterality Date  . CESAREAN SECTION    . TONSILLECTOMY    . WISDOM TOOTH EXTRACTION      Current Outpatient Medications  Medication Sig Dispense Refill  . acetaminophen (TYLENOL) 500 MG tablet Take 1,000 mg by mouth every 6 (six) hours as needed for headache (pain).    . Ascorbic Acid (VITAMIN C PO) Take 1 tablet by mouth at bedtime.    Marland Kitchen aspirin 81 MG chewable tablet Chew 81 mg by mouth at bedtime.     . Cholecalciferol (VITAMIN D3) 5000 UNITS CAPS Take 5,000 Units by mouth at bedtime.    . Coenzyme Q10 (COQ10) 200 MG CAPS Take 200 mg by mouth at bedtime.    . Cyanocobalamin (VITAMIN B-12) 2500 MCG SUBL Place 2,500 mcg under the tongue at bedtime.    Marland Kitchen doxylamine, Sleep, (UNISOM) 25 MG tablet Take 12.5 mg by mouth at bedtime as needed.     . Magnesium 250 MG TABS Take 250 mg by mouth at bedtime.    . Melatonin 3 MG TABS Take 3 mg by mouth at bedtime.      No current facility-administered medications for this visit.     Allergies as of 09/19/2018 -  Review Complete 09/19/2018  Allergen Reaction Noted  . Demerol [meperidine] Other (See Comments) 11/28/2013    Vitals: BP 138/84   Pulse 77   Ht 5' 8" (1.727 m)   Wt 239 lb (108.4 kg)   BMI 36.34 kg/m  Last Weight:  Wt Readings from Last 1 Encounters:  09/19/18 239 lb (108.4 kg)       Last Height:   Ht Readings from Last 1 Encounters:  09/19/18 5' 8" (1.727 m)    Physical exam:  General: The patient is awake, alert and appears not in acute distress. The patient is well groomed. Head: Normocephalic, atraumatic. Neck is supple.  Mallampati 4 , invisible uvula  Neck  circumference: 15.5 . Nasal airflow unresricted, TMJ is not evident . Retrognathia is seen.  Cardiovascular:  Regular rate and rhythm, without  murmurs or carotid bruit, and without distended neck veins. Respiratory: Lungs are clear to auscultation. Skin:  Without evidence of edema, or rash Trunk: BMI is elevated and patient  has normal posture.  Neurologic exam : The patient is awake and alert, oriented to place and time.  Memory subjective described as intact. There is a normal attention span & concentration ability.  Speech is fluent without  dysarthria, dysphonia or aphasia. Mood and affect are depressed, frustrated with her health.   Cranial nerves: Pupils are equal and briskly reactive to light.Extraocular movements in vertical and horizontal planes intact and without nystagmus/ intact. Hearing intact. Facial sensation intact to fine touch. Facial motor strength is symmetric and tongue and uvula move midline. Motor exam:  Normal tone, muscle bulk and symmetric, strength in all extremities.  Coordination: Rapid alternating movements in the fingers/hands is normal. Finger-to-nose maneuver without evidence of ataxia, dysmetria or tremor. Gait and station: Patient walks without assistive device and is able unassisted to climb up to the exam table.  Strength within normal limits. She wears a knee brace. Stance is stable and normal.  Deep tendon reflexes: in the upper and lower extremities are symmetric and downgoing.  Assessment:  After physical and neurologic examination, review of laboratory studies, imaging, neurophysiology testing and pre-existing records, assessment is    Retired Therapist, sports,  Obstructive sleep apnea to a severe degree with a 50.3 AHI. The patient has been doing well with the CPAP machine, 100%  compliance .  Change CPAP setting to 10 cm water.  Restless sleeper, kicking. PLMD? She states she feels hot and cold. She denies RLS.  Still relies on OTC sleep aids. Her insomnia had  initially much improved, but now has resurged.  She may benefit from HRT, progesterone helps to sleep, may discuss with Gynecologist.  She still snores some nights, she still has some screen light in the bedroom.  Nocturia times 2. Insomnia is partially due to habits, light exposure.      DME  [facility] /  CC Dr. Cathlean Sauer Dohmeier MD  09/19/2018

## 2019-02-05 ENCOUNTER — Telehealth: Payer: Self-pay | Admitting: Neurology

## 2019-02-05 NOTE — Telephone Encounter (Signed)
Called the patient there was no answer. Left a detailed message informing the patient that she will need to reschedule her apt since Dr Vickey Huger will be out of the office on vacation. Patient was last seen oct 2019 and as long as she is seen by 09/2019 we are good. This apt was a 6 mth follow up.  If patient calls back please offer her an apt with But[facility] Penny upon her return back. Or Dr Vickey Huger after vacation. Advised I would be cancelling apt in April and she would need to call back to reschedule

## 2019-03-27 ENCOUNTER — Ambulatory Visit: Payer: PRIVATE HEALTH INSURANCE | Admitting: Neurology

## 2019-05-07 ENCOUNTER — Other Ambulatory Visit: Payer: Self-pay | Admitting: Internal Medicine

## 2019-05-07 DIAGNOSIS — E785 Hyperlipidemia, unspecified: Secondary | ICD-10-CM

## 2019-06-26 ENCOUNTER — Ambulatory Visit
Admission: RE | Admit: 2019-06-26 | Discharge: 2019-06-26 | Disposition: A | Discharge status: Home / Self Care | Payer: No Typology Code available for payment source | Source: Ambulatory Visit | Attending: Internal Medicine | Admitting: Internal Medicine

## 2019-06-26 ENCOUNTER — Other Ambulatory Visit: Payer: Self-pay

## 2019-06-26 DIAGNOSIS — E785 Hyperlipidemia, unspecified: Secondary | ICD-10-CM

## 2020-03-28 ENCOUNTER — Ambulatory Visit: Payer: PRIVATE HEALTH INSURANCE

## 2020-03-30 ENCOUNTER — Ambulatory Visit: Payer: PRIVATE HEALTH INSURANCE | Attending: Internal Medicine

## 2020-03-30 DIAGNOSIS — Z20822 Contact with and (suspected) exposure to covid-19: Secondary | ICD-10-CM

## 2020-03-31 LAB — SARS-COV-2, NAA 2 DAY TAT

## 2020-03-31 LAB — NOVEL CORONAVIRUS, NAA: SARS-CoV-2, NAA: NOT DETECTED

## 2020-09-18 IMAGING — CT CT HEART SCORING
3 series · 13 of 20 positions shown, 15 images · non-contrast
Comparison: None.
COMPARISON: None.
COMPARISON: None.

Addendum:
EXAM:
OVER-READ INTERPRETATION  CT CHEST

The following report is an over-read performed by radiologist Dr.
Nimra Cromer [REDACTED] on 06/26/2019. This
over-read does not include interpretation of cardiac or coronary
anatomy or pathology. The coronary calcium score interpretation by
the cardiologist is attached.
CLINICAL DATA: 63-year-old Caucasian female with history of
hypertension.
CT HEART FOR CALCIUM SCORING
TECHNIQUE: CT heart was performed on a 64 channel system using prospective ECG
gating. A scout and noncontrast exam (for calcium scoring) were
performed. Note that this exam targets the heart and the chest was
not imaged in its entirety.

[Series 2: calcium scoring 2.00 qr36 bestdiast 69% · axial · 0.35mm/px · z∈[+1683,+1739]mm · 3 of 70 slices shown]
[im 14/70  vessel]
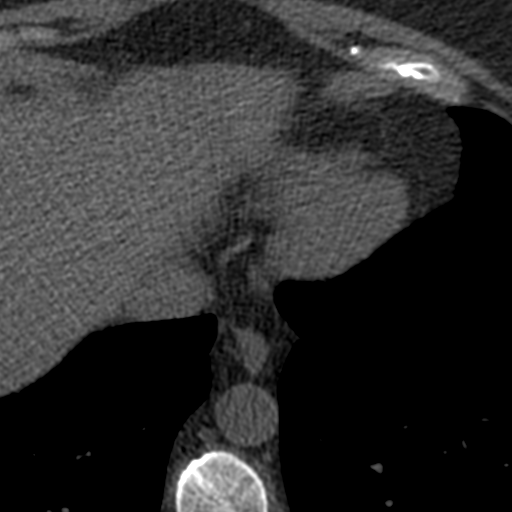
[im 28/70  vessel]
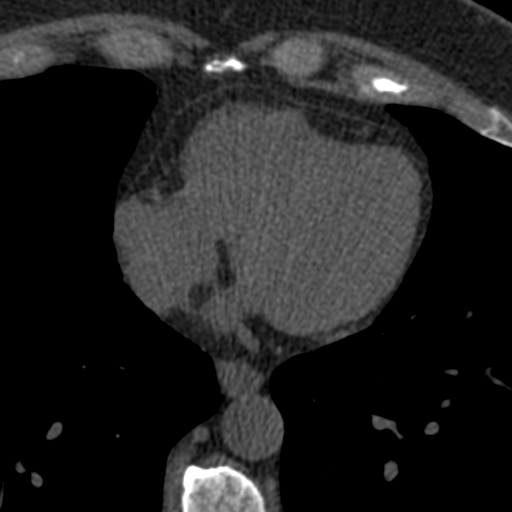
[im 42/70  vessel]
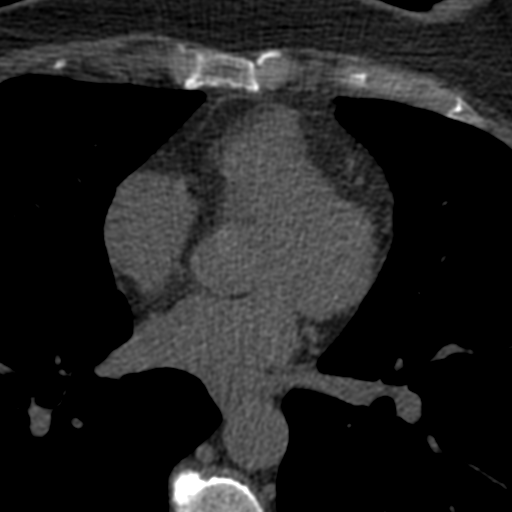

[Series 3: calcium scoring 2.00 br40 bestdiast 69% fov · axial · 0.64mm/px · z∈[+1679,+1771]mm · 5 of 70 slices shown, 7 images]
[im 12/70  vessel]
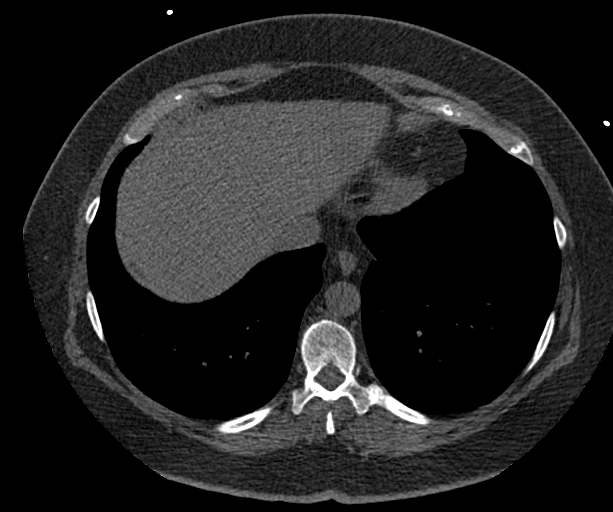
[im 12/70  lung]
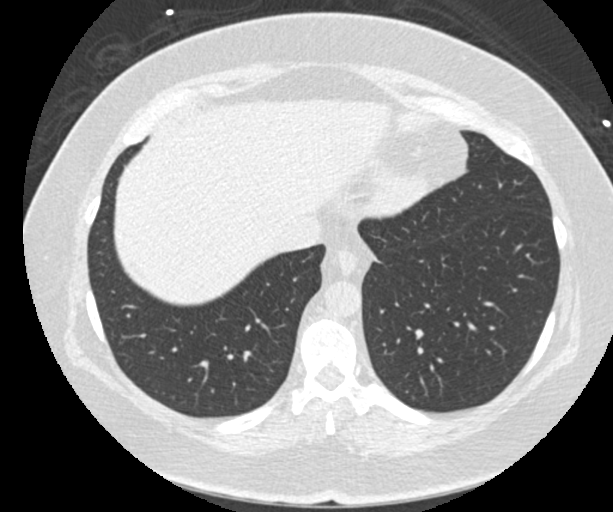
[im 24/70  vessel]
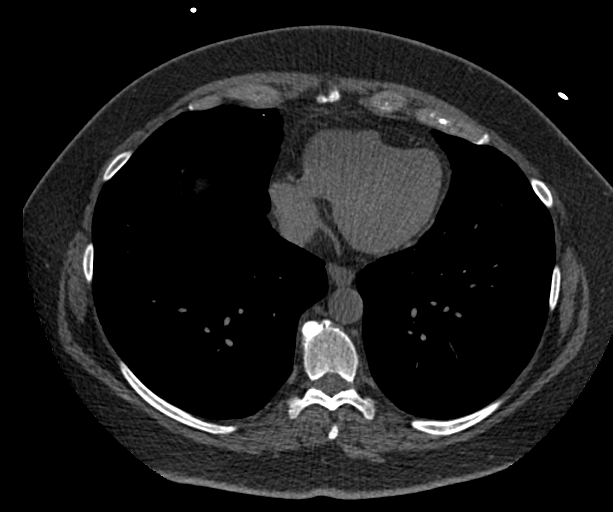
[im 35/70  vessel]
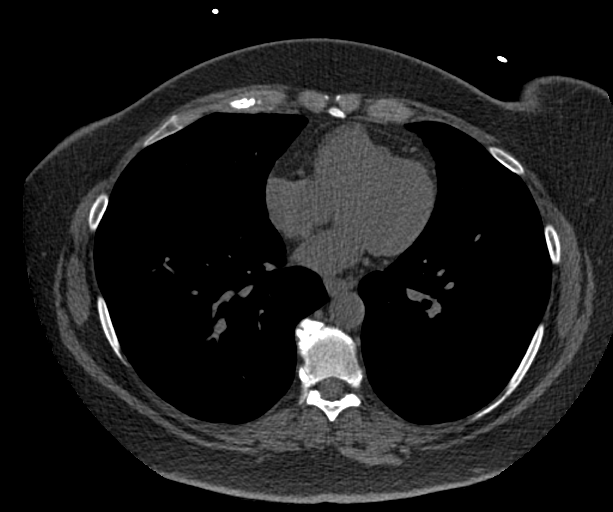
[im 47/70  vessel]
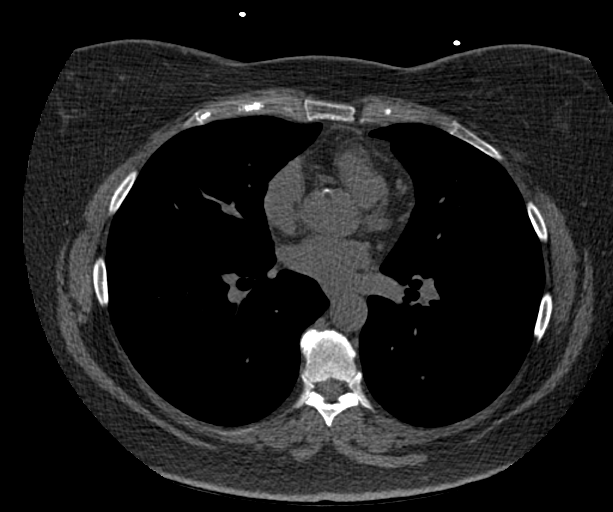
[im 58/70  vessel]
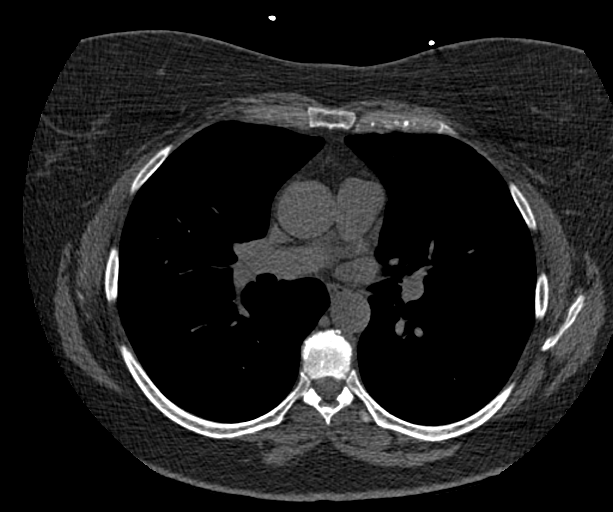
[im 58/70  lung]
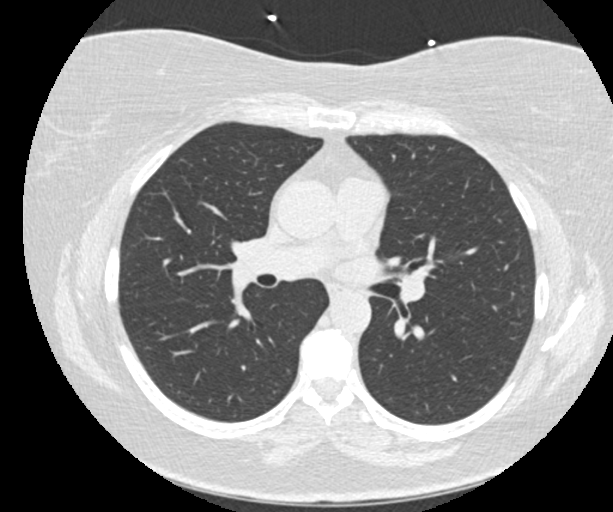

[Series 9: calcium scoring 2.00 br60 bestdiast 69% fov · axial · 0.64mm/px · z∈[+1679,+1771]mm · 5 of 70 slices shown]
[im 12/70  vessel]
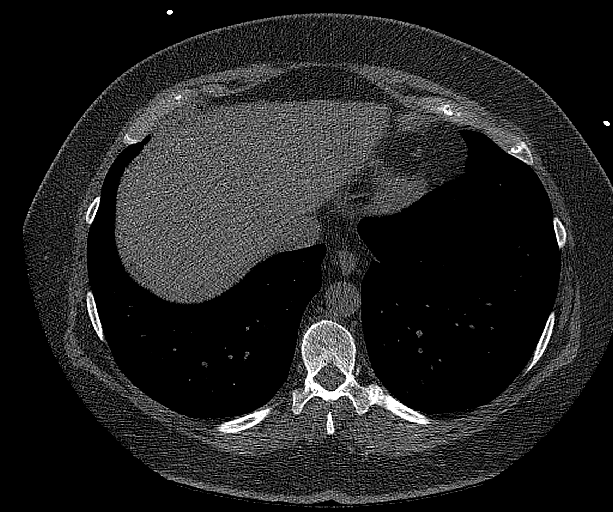
[im 24/70  vessel]
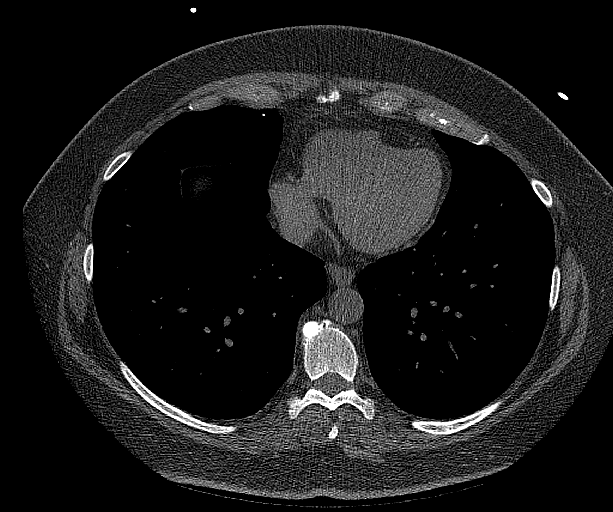
[im 35/70  vessel]
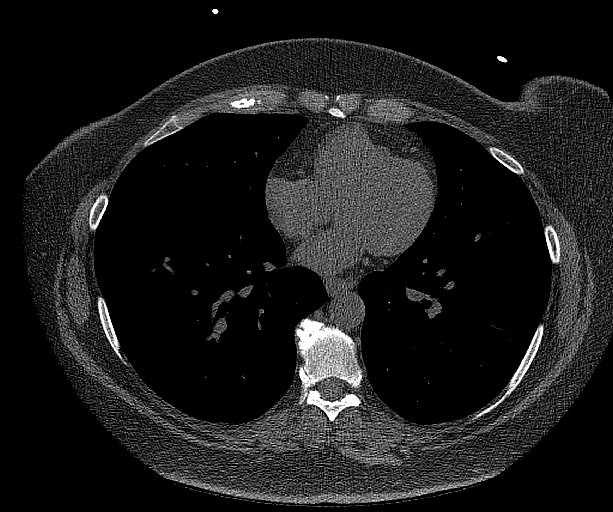
[im 47/70  vessel]
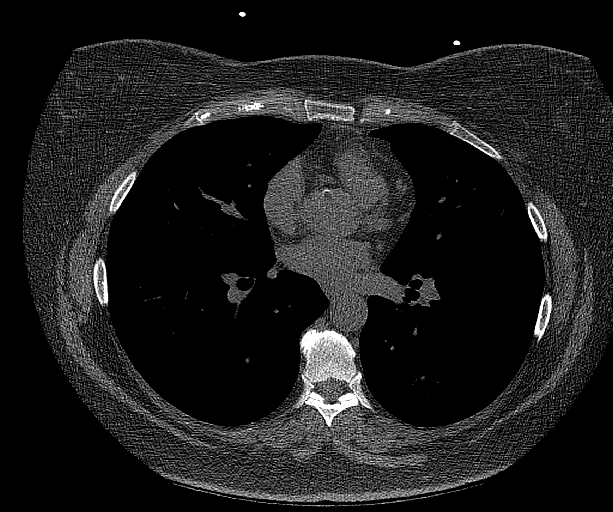
[im 58/70  vessel]
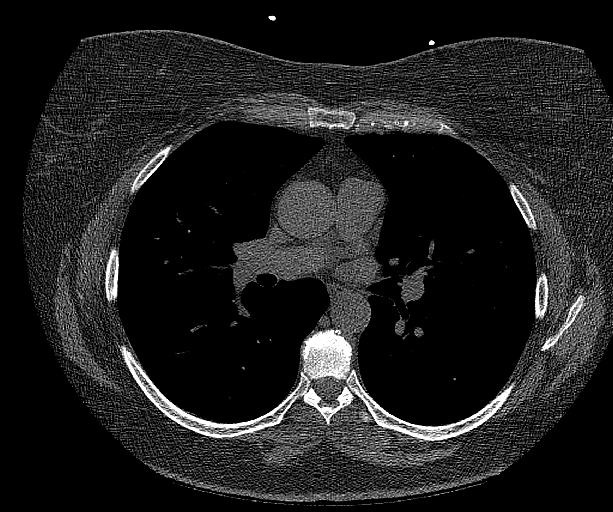

[13 of 20 positions shown; findings below may reference images not displayed]

FINDINGS: Aortic atherosclerosis. Multiple small 2-5 mm pulmonary nodules in
the lungs bilaterally, largest of which is in the left lower lobe
measuring 5 mm (axial image 18 of series 9). Within the visualized
portions of the thorax there are no other larger more suspicious
appearing pulmonary nodules or masses, there is no acute
consolidative airspace disease, no pleural effusions, no
pneumothorax and no lymphadenopathy. Visualized portions of the
upper abdomen demonstrates mild diffuse low attenuation throughout
the visualized hepatic parenchyma. There are no aggressive appearing
lytic or blastic lesions noted in the visualized portions of the
skeleton.
IMPRESSION: 1. Multiple small 2-5 mm pulmonary nodules scattered throughout the
lungs bilaterally, nonspecific, but statistically likely benign. No
follow-up needed if patient is low-risk (and has no known or
suspected primary neoplasm). Non-contrast chest CT can be considered
in 12 months if patient is high-risk. This recommendation follows
the consensus statement: Guidelines for Management of Incidental
Pulmonary Nodules Detected on CT Images: From the [HOSPITAL]
2.  Aortic Atherosclerosis (E8NSY-PH1.1).
3. Hepatic steatosis.
FINDINGS: Technical quality: Excellent

CORONARY CALCIUM

Total Agatston Score: 4

[HOSPITAL] percentile:  60th
IMPRESSION: 1. Patient's total coronary artery calcium score is 4 which is 60th
percentile for patient's of matched age, gender and race/ethnicity.
Please note that although the presence of coronary artery calcium
documents the presence of coronary artery disease, the severity of
this disease and any potential stenosis cannot be assessed on this
noncontrast CT examination. Assessment for potential risk factor
modification, dietary therapy or pharmacologic therapy may be
warranted, if clinically indicated.

*** End of Addendum ***
EXAM:
OVER-READ INTERPRETATION  CT CHEST

The following report is an over-read performed by radiologist Dr.
Nimra Cromer [REDACTED] on 06/26/2019. This
over-read does not include interpretation of cardiac or coronary
anatomy or pathology. The coronary calcium score interpretation by
the cardiologist is attached.
FINDINGS: Aortic atherosclerosis. Multiple small 2-5 mm pulmonary nodules in
the lungs bilaterally, largest of which is in the left lower lobe
measuring 5 mm (axial image 18 of series 9). Within the visualized
portions of the thorax there are no other larger more suspicious
appearing pulmonary nodules or masses, there is no acute
consolidative airspace disease, no pleural effusions, no
pneumothorax and no lymphadenopathy. Visualized portions of the
upper abdomen demonstrates mild diffuse low attenuation throughout
the visualized hepatic parenchyma. There are no aggressive appearing
lytic or blastic lesions noted in the visualized portions of the
skeleton.
IMPRESSION: 1. Multiple small 2-5 mm pulmonary nodules scattered throughout the
lungs bilaterally, nonspecific, but statistically likely benign. No
follow-up needed if patient is low-risk (and has no known or
suspected primary neoplasm). Non-contrast chest CT can be considered
in 12 months if patient is high-risk. This recommendation follows
the consensus statement: Guidelines for Management of Incidental
Pulmonary Nodules Detected on CT Images: From the [HOSPITAL]
2.  Aortic Atherosclerosis (E8NSY-PH1.1).
3. Hepatic steatosis.

## 2020-09-29 ENCOUNTER — Other Ambulatory Visit: Payer: Self-pay | Admitting: Otolaryngology

## 2020-09-29 DIAGNOSIS — R220 Localized swelling, mass and lump, head: Secondary | ICD-10-CM

## 2020-11-24 ENCOUNTER — Telehealth: Payer: Self-pay

## 2020-11-24 ENCOUNTER — Ambulatory Visit: Payer: PRIVATE HEALTH INSURANCE | Admitting: Neurology

## 2020-11-24 NOTE — Telephone Encounter (Signed)
Patients appt for this morning was cancelled by mistake with automated confirmation. Pt is calling bc the next available appt is not until Jan 2022. Pt is needing a letter to her insurance in regards to her CPAP machine. Her insurance year ends 12/18/2020 and this needs to be handle before then. Is there an earlier appt or could she have a virtual visit prior to end of year. Contact patient with best solution.

## 2020-11-24 NOTE — Telephone Encounter (Signed)
Called the patient back. I was able to offer 12/21 at 10 am with Butch Penny, NP for her to be seen. Pt is 100% compliant and is eligible for a new machine. Patient was asking if she would be able to get the machine before the end of the year. Advised her of the nationwide shortage on CPAP's and that typical turn around time frame is 8-12 weeks. Informed the patient since she had billing questions and concerns that she would need to contact [facility])

## 2020-12-08 ENCOUNTER — Telehealth: Payer: Self-pay | Admitting: Neurology

## 2020-12-08 ENCOUNTER — Ambulatory Visit: Payer: Self-pay | Admitting: Adult Health

## 2020-12-08 ENCOUNTER — Telehealth (INDEPENDENT_AMBULATORY_CARE_PROVIDER_SITE_OTHER): Payer: PRIVATE HEALTH INSURANCE | Admitting: Adult Health

## 2020-12-08 DIAGNOSIS — Z9989 Dependence on other enabling machines and devices: Secondary | ICD-10-CM | POA: Diagnosis not present

## 2020-12-08 DIAGNOSIS — G4733 Obstructive sleep apnea (adult) (pediatric): Secondary | ICD-10-CM | POA: Diagnosis not present

## 2020-12-08 NOTE — Progress Notes (Signed)
PATIENT: Shelley Bean DOB: 05-23-1956  REASON FOR VISIT: follow up HISTORY FROM: patient  Virtual Visit via Video Note  I connected with Shelley Bean on 12/08/20 at  3:00 PM EST by a video enabled telemedicine application located remotely at [facility] and verified that I am speaking with the correct person using two identifiers who was located at their own home.   I discussed the limitations of evaluation and management by telemedicine and the availability of in person appointments. The patient expressed understanding and agreed to proceed.   PATIENT: Shelley Bean DOB: [birth year]  REASON FOR VISIT: follow up HISTORY FROM: patient  HISTORY OF PRESENT ILLNESS: Today 12/08/20:  Shelley Bean is a 64 year old female with a history of obstructive sleep apnea on CPAP.  Her download indicates that she used her machine 30 out of 30 days for compliance of 100%.  She use her machine greater than 4 hours 29 days for compliance of 97%.  On average she uses her machine 6 hours and 34 minutes.  Her residual AHI is 1.9 on 7 to 10 cm of water with EPR of 3.  Leak in the 95th percentile is 8.1 L/min.  She reports that she purchase CPAP machine through [facility] she needs a prescription sent over so she can be reimbursed.  HISTORY Shelley Bean is a 64 y.o. female seen here as a revisit from Dr. Waynard Edwards for a sleep consultation.    Shelley Bean has been seen in our sleep clinic for many years, and has been a compliant CPAP user. She developed sleepiness again over the last year , feeling exhausted but not able to get the best quality sleep. She has trouble to initiate sleep, often stares at the ceiling for 2 hours or longer. She has trouble to get comfortable, had a herniated disc- did not need surgery. She looks for answers. She has gained some weight- was 2 years ago at 210, now gained 25 pounds. Shelley Bean compliance to CPAP is excellent, she has used  the machine 100% by time with an average time of nightly use of 7 hours and 4 minutes, CPAP is an auto sense air sense 10 with a set pressure of 9 cmH2O 3 cm EPR and a residual AHI of 2.8.  Of these residual apneas 2.1) per hour are obstructive in nature which means that we should probably increase her pressure by a centimeter at least.  95th percentile air leak was 4.2 L overall however her apnea is very well controlled and as such as that before she is highly compliant.  She wears with a dry mouth, is still snoring.  She is now using a DreamWear mask and finds this quite comfortable. Most mornings she awakens with a smiley face.  She endorsed the fatigue severity questionnaire today at 23 out of 63 points the Epworth Sleepiness Scale at only 1 out of 24 possible points.  Her chief complaint is insomnia. She has reduced coffee to cups a day, no sodas, no tea. Has been on a keto diet.  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  She is often sweating at night- She uses a blue light filter on her smart phone and has a clock in sight- we should eliminate these.    REVIEW OF SYSTEMS: Out of a complete 14 system review of symptoms, the patient complains only of the following symptoms, and all other  reviewed systems are negative.  See HPI  ALLERGIES: Allergies  Allergen Reactions  . Demerol [Meperidine] Other (See Comments)    paradoxical reaction (makes her hyper)    HOME MEDICATIONS: Outpatient Medications Prior to Visit  Medication Sig Dispense Refill  . acetaminophen (TYLENOL) 500 MG tablet Take 1,000 mg by mouth every 6 (six) hours as needed for headache (pain).    . Ascorbic Acid (VITAMIN C PO) Take 1 tablet by mouth at bedtime.    Marland Kitchen aspirin 81 MG chewable tablet Chew 81 mg by mouth at bedtime.     . Cholecalciferol (VITAMIN D3) 5000 UNITS CAPS Take 5,000 Units by mouth at bedtime.    . Coenzyme Q10 (COQ10) 200 MG CAPS Take 200 mg  by mouth at bedtime.    . Cyanocobalamin (VITAMIN B-12) 2500 MCG SUBL Place 2,500 mcg under the tongue at bedtime.    Marland Kitchen doxylamine, Sleep, (UNISOM) 25 MG tablet Take 12.5 mg by mouth at bedtime as needed.     . Magnesium 250 MG TABS Take 250 mg by mouth at bedtime.    . Melatonin 3 MG TABS Take 3 mg by mouth at bedtime.     . traZODone (DESYREL) 50 MG tablet Take 0.5 tablets (25 mg total) by mouth at bedtime as needed for sleep. 45 tablet 0   No facility-administered medications prior to visit.    PAST MEDICAL HISTORY: Past Medical History:  Diagnosis Date  . Tear of meniscus of left knee 12/2014   wearing brace, doing PT    PAST SURGICAL HISTORY: Past Surgical History:  Procedure Laterality Date  . CESAREAN SECTION    . TONSILLECTOMY    . WISDOM TOOTH EXTRACTION      FAMILY HISTORY: Family History  Problem Relation Age of Onset  . Pulmonary fibrosis Father   . Pulmonary Hypertension Mother     SOCIAL HISTORY: Social History   Socioeconomic History  . Marital status: Married    Spouse name: Not on file  . Number of children: 3  . Years of education: Charity fundraiser  . Highest education level: Not on file  Occupational History  . Not on file  Tobacco Use  . Smoking status: Never Smoker  . Smokeless tobacco: Never Used  Substance and Sexual Activity  . Alcohol use: Yes    Alcohol/week: 0.0 standard drinks    Comment: socialholidays  . Drug use: No  . Sexual activity: Not on file  Other Topics Concern  . Not on file  Social History Narrative   Caffeine 1-2 cups daily avg.    Social Determinants of Health   Financial Resource Strain: Not on file  Food Insecurity: Not on file  Transportation Needs: Not on file  Physical Activity: Not on file  Stress: Not on file  Social Connections: Not on file  Intimate Partner Violence: Not on file      PHYSICAL EXAM Generalized: Well developed, in no acute distress   Neurological examination  Mentation: Alert oriented to  time, place, history taking. Follows all commands speech and language fluent Cranial nerve II-XII:Extraocular movements were full. Facial symmetry noted. uvula tongue midline. Head turning and shoulder shrug  were normal and symmetric. Motor: Good strength throughout subjectively per patient Sensory: Sensory testing is intact to soft touch on all 4 extremities subjectively per patient Coordination: Cerebellar testing reveals good finger-nose-finger  Gait and station: Patient is able to stand from a seated position. gait is normal.  Reflexes: UTA  DIAGNOSTIC DATA (LABS, IMAGING, TESTING) -  I reviewed patient records, labs, notes, testing and imaging myself where available.  Lab Results  Component Value Date   WBC 9.5 01/14/2017   HGB 15.5 (H) 01/14/2017   HCT 45.4 01/14/2017   MCV 90.1 01/14/2017   PLT 194 01/14/2017      Component Value Date/Time   NA 138 01/14/2017 1147   K 4.0 01/14/2017 1147   CL 102 01/14/2017 1147   CO2 26 01/14/2017 1147   GLUCOSE 127 (H) 01/14/2017 1147   BUN 14 01/14/2017 1147   CREATININE 0.84 01/14/2017 1147   CALCIUM 9.4 01/14/2017 1147   PROT 7.0 11/28/2013 0520   ALBUMIN 3.7 11/28/2013 0520   AST 16 11/28/2013 0520   ALT 20 11/28/2013 0520   ALKPHOS 72 11/28/2013 0520   BILITOT 0.3 11/28/2013 0520   GFRNONAA >60 01/14/2017 1147   GFRAA >60 01/14/2017 1147      ASSESSMENT AND PLAN 64 y.o. year old female  has a past medical history of Tear of meniscus of left knee (12/2014). here with:  OSA on CPAP  . CPAP compliance excellent . Residual AHI is good . Encouraged patient to continue using CPAP nightly and > 4 hours each night . We will fill out form that she sent to [place] for CPAP machine . F/U in 1 year or sooner if needed  I spent 20 minutes of face-to-face and non-face-to-face time with patient.  This included previsit chart review, lab review, study review, order entry, electronic health record documentation, patient education.  Butch Penny, MSN, NP-C 12/08/2020, 3:34 PM [facility] [address], [address] [place], [place] [address] [phone]

## 2020-12-08 NOTE — Telephone Encounter (Signed)
Re: the my chart appointment this afternoon pt wants Megan,NP to look for the attachment she sent Megan via Mychart re: her CPAP so she can be aware of it during the visit this afternoon

## 2020-12-09 ENCOUNTER — Telehealth: Payer: Self-pay | Admitting: Adult Health

## 2020-12-09 ENCOUNTER — Encounter: Payer: Self-pay | Admitting: *Deleted

## 2020-12-09 NOTE — Telephone Encounter (Signed)
Spoke to pt .  It will take sometime for [facility] to get her machine.  She would like letter stating it is medically necessary for her to get new machine as the machine she has is over 64 yrs old.  I relayed to mycahrt as MM/NP out next week and will not be checking emails.

## 2020-12-09 NOTE — Telephone Encounter (Signed)
I called pt .  I LMVM for her that per MM/NP could do letter for her but would not place it as a medical necessity as per MM/NP understood and cpap DL did show that she was using her cpap machine. Since her machine is usable, the orders for pts that are new cpap users with severe apnea will most likely be given machines prior to just needing a new machine if machines are working for them. It is would just take time for them to be filled.  Hopefully this made sense, of course if questions to call me back.

## 2020-12-09 NOTE — Progress Notes (Signed)
Fax confirmation received of cpap orders to [facility] [phone].

## 2020-12-09 NOTE — Telephone Encounter (Signed)
Pt called, received a e-mail from [facility], have not received the prescription for CPAP. Please sent a copy to my e-mail what is faxed. Would like a call from the nurse.

## 2020-12-09 NOTE — Telephone Encounter (Signed)
I do not mind providing her with a letter however if her old machine is working fine which to my knowledge it is it is not imperative that she have a new machine right away

## 2020-12-22 ENCOUNTER — Ambulatory Visit: Payer: PRIVATE HEALTH INSURANCE | Admitting: Neurology

## 2021-08-30 ENCOUNTER — Telehealth: Payer: Self-pay | Admitting: Adult Health

## 2021-08-30 DIAGNOSIS — F5102 Adjustment insomnia: Secondary | ICD-10-CM

## 2021-08-30 DIAGNOSIS — Z9989 Dependence on other enabling machines and devices: Secondary | ICD-10-CM

## 2021-08-30 DIAGNOSIS — R0683 Snoring: Secondary | ICD-10-CM

## 2021-08-30 DIAGNOSIS — G4733 Obstructive sleep apnea (adult) (pediatric): Secondary | ICD-10-CM

## 2021-08-30 NOTE — Telephone Encounter (Signed)
Pt scheduled in Dec. With NP Megan however pt is requesting to see Dr. Vickey Huger since she needs a new CPAP machine, pt was very rude on the phone and I informed her that I was going to hold off on rescheduling her since she was giving me such a hard time. Pt  requesting a call back.

## 2021-08-30 NOTE — Telephone Encounter (Signed)
Please send to Dr. Vickey Huger- since the patient is requesting to see her back in the office. She should decide the study that is needed.

## 2021-08-30 NOTE — Telephone Encounter (Signed)
I called the patient.  She wanted to proceed with getting a new machine since she is eligible.  She is not able to get downloads like she normally has done.  I relayed it might be due to the change of 3G to 5G cell tower remote access.  She understands that if she does get new order for new machine it may require a new sleep study as its been over 5 years and she was okay with that.  Her preference being in lab study if her insurance will approve that.  She also requested to see Dr. Vickey Huger at her next appt.  I relayed will check with Megan/NP and get back with her about all of the above.

## 2021-08-31 NOTE — Telephone Encounter (Signed)
I called pt I relayed that Dr. Vickey Huger ordered the PSG/SPLIT test if insurance allows if not will do HST then titration.  Sleep lab to authorize testing, then will proceed.  She did have questions of cost and I was not able to answer, she may ask when called about her sleep tests.  New machine to be ordered after testing done.  Has f/u 12-07-21 with Dr. Vickey Huger, per pt request.  She appreciated call.  She is to call back if questions.

## 2021-08-31 NOTE — Addendum Note (Signed)
Addended by: Melvyn Novas on: 08/31/2021 12:49 PM   Modules accepted: Orders

## 2021-08-31 NOTE — Telephone Encounter (Signed)
Fine with me to follow up after new machine . I will order a PSG / SPLIT.   If [employer] doesn't permit this , I will order a HST first, then following with auto titration.

## 2021-09-06 ENCOUNTER — Other Ambulatory Visit: Payer: Self-pay | Admitting: Neurology

## 2021-09-06 ENCOUNTER — Telehealth: Payer: Self-pay

## 2021-09-06 NOTE — Telephone Encounter (Signed)
Pt returned call. I was able to inform her that insurance will require a updated office note to submit for insurance in order to get a sleep study covered.  I was able to get the patient in for a quick visit with Butch Penny for 09/07/21.  Advised the patient to bring her card for download of her machine.  Informed her at that visit we will document compliance and order a sleep study and at that point we will be able to submit for insurance.  Patient was appreciative for the call except of the appointment.

## 2021-09-06 NOTE — Telephone Encounter (Signed)
Phone note from me: Called the patient back to discuss coming in for an apt. There was no answer. LVM asking for a call back or she can review mychart message. Will await a call back.      Phone note from [employer] Note Per the sleep lab manager insurance will not cover a new sleep study for this pt without a visit. The pt has not been seen since 11/2020. The pt will either need to come in to see Dr. Vickey Huger (the pt has requested to see doc and not the NP) or Dr. Vickey Huger can order for the pt to get a new CPAP machine and the pt can keep her f/u appt with Dr. Vickey Huger in Dec. Please advise.

## 2021-09-06 NOTE — Telephone Encounter (Signed)
Per the sleep lab manager insurance will not cover a new sleep study for this pt without a visit. The pt has not been seen since 11/2020. The pt will either need to come in to see Dr. Vickey Huger (the pt has requested to see doc and not the NP) or Dr. Vickey Huger can order for the pt to get a new CPAP machine and the pt can keep her f/u appt with Dr. Vickey Huger in Dec. Please advise.

## 2021-09-06 NOTE — Telephone Encounter (Signed)
Called the patient back to discuss coming in for an apt. There was no answer. LVM asking for a call back or she can review mychart message. Will await a call back.

## 2021-09-07 ENCOUNTER — Ambulatory Visit (INDEPENDENT_AMBULATORY_CARE_PROVIDER_SITE_OTHER): Payer: No Typology Code available for payment source | Admitting: Adult Health

## 2021-09-07 ENCOUNTER — Encounter: Payer: Self-pay | Admitting: Adult Health

## 2021-09-07 ENCOUNTER — Other Ambulatory Visit: Payer: Self-pay

## 2021-09-07 VITALS — BP 117/69 | HR 64 | Ht 68.0 in | Wt 242.0 lb

## 2021-09-07 DIAGNOSIS — G4733 Obstructive sleep apnea (adult) (pediatric): Secondary | ICD-10-CM

## 2021-09-07 DIAGNOSIS — Z9989 Dependence on other enabling machines and devices: Secondary | ICD-10-CM

## 2021-09-07 NOTE — Progress Notes (Signed)
PATIENT: Shelley Bean DOB: 10-May-1956  REASON FOR VISIT: follow up HISTORY FROM: patient PRIMARY NEUROLOGIST:   HISTORY OF PRESENT ILLNESS: Today 09/07/21:  Shelley Bean is a 65 year old female with a history of obstructive sleep apnea on CPAP.  She returns today for follow-up.  The patient has an older machine without the wireless capabilities.  She would like a new machine but must repeat sleep test.  Insurance required another office visit.  Her download is attached to this note.  Her CPAP shows excellent compliance.    REVIEW OF SYSTEMS: Out of a complete 14 system review of symptoms, the patient complains only of the following symptoms, and all other reviewed systems are negative.  FSS 13 ESS 1  ALLERGIES: Allergies  Allergen Reactions   Demerol [Meperidine] Other (See Comments)    paradoxical reaction (makes her hyper)    HOME MEDICATIONS: Outpatient Medications Prior to Visit  Medication Sig Dispense Refill   aspirin 81 MG chewable tablet Chew 81 mg by mouth at bedtime.      Cholecalciferol (VITAMIN D3) 5000 UNITS CAPS Take 5,000 Units by mouth at bedtime.     Coenzyme Q10 (COQ10) 200 MG CAPS Take 200 mg by mouth at bedtime.     doxylamine, Sleep, (UNISOM) 25 MG tablet Take 12.5 mg by mouth at bedtime as needed.      Magnesium 250 MG TABS Take 250 mg by mouth at bedtime.     Melatonin 3 MG TABS Take 3 mg by mouth at bedtime.      olmesartan-hydrochlorothiazide (BENICAR HCT) 20-12.5 MG tablet Take 0.5 tablets by mouth daily.     rosuvastatin (CRESTOR) 10 MG tablet Take 10 mg by mouth daily.     UNABLE TO FIND Med Name: Ginkgo Biloba     Ascorbic Acid (VITAMIN C PO) Take 1 tablet by mouth at bedtime. (Patient not taking: Reported on 09/07/2021)     Cyanocobalamin (VITAMIN B-12) 2500 MCG SUBL Place 2,500 mcg under the tongue at bedtime. (Patient not taking: Reported on 09/07/2021)     No facility-administered medications prior to visit.    PAST MEDICAL  HISTORY: Past Medical History:  Diagnosis Date   Tear of meniscus of left knee 12/2014   wearing brace, doing PT    PAST SURGICAL HISTORY: Past Surgical History:  Procedure Laterality Date   CESAREAN SECTION     TONSILLECTOMY     WISDOM TOOTH EXTRACTION      FAMILY HISTORY: Family History  Problem Relation Age of Onset   Pulmonary fibrosis Father    Pulmonary Hypertension Mother     SOCIAL HISTORY: Social History   Socioeconomic History   Marital status: Married    Spouse name: Not on file   Number of children: 3   Years of education: RN   Highest education level: Not on file  Occupational History   Not on file  Tobacco Use   Smoking status: Never   Smokeless tobacco: Never  Substance and Sexual Activity   Alcohol use: Yes    Alcohol/week: 0.0 standard drinks    Comment: socialholidays   Drug use: No   Sexual activity: Not on file  Other Topics Concern   Not on file  Social History Narrative   Caffeine 1-2 cups daily avg.    Social Determinants of Health   Financial Resource Strain: Not on file  Food Insecurity: Not on file  Transportation Needs: Not on file  Physical Activity: Not on file  Stress: Not  on file  Social Connections: Not on file  Intimate Partner Violence: Not on file      PHYSICAL EXAM  Vitals:   09/07/21 1353  BP: 117/69  Pulse: 64  Weight: 242 lb (109.8 kg)  Height: 5\' 8"  (1.727 m)   Body mass index is 36.8 kg/m.  Generalized: Well developed, in no acute distress  Chest: Lungs clear to auscultation bilaterally  Neurological examination  Mentation: Alert oriented to time, place, history taking. Follows all commands speech and language fluent  Gait and station: Gait is normal.    DIAGNOSTIC DATA (LABS, IMAGING, TESTING) - I reviewed patient records, labs, notes, testing and imaging myself where available.  Lab Results  Component Value Date   WBC 9.5 01/14/2017   HGB 15.5 (H) 01/14/2017   HCT 45.4 01/14/2017   MCV  90.1 01/14/2017   PLT 194 01/14/2017      Component Value Date/Time   NA 138 01/14/2017 1147   K 4.0 01/14/2017 1147   CL 102 01/14/2017 1147   CO2 26 01/14/2017 1147   GLUCOSE 127 (H) 01/14/2017 1147   BUN 14 01/14/2017 1147   CREATININE 0.84 01/14/2017 1147   CALCIUM 9.4 01/14/2017 1147   PROT 7.0 11/28/2013 0520   ALBUMIN 3.7 11/28/2013 0520   AST 16 11/28/2013 0520   ALT 20 11/28/2013 0520   ALKPHOS 72 11/28/2013 0520   BILITOT 0.3 11/28/2013 0520   GFRNONAA >60 01/14/2017 1147   GFRAA >60 01/14/2017 1147      ASSESSMENT AND PLAN 65 y.o. year old female  has a past medical history of Tear of meniscus of left knee (12/2014). here with:  OSA on CPAP  - CPAP compliance excellent - Good treatment of AHI  - Encourage patient to use CPAP nightly and > 4 hours each night -We will proceed with in lab sleep study if not covered by insurance patient will have home sleep test   06-28-1981, MSN, NP-C 09/07/2021, 1:58 PM [facility] [address] [place], [place] [phone]

## 2021-10-25 ENCOUNTER — Telehealth: Payer: Self-pay

## 2021-10-25 NOTE — Telephone Encounter (Signed)
LVM for pt to call me back to schedule sleep study  

## 2021-11-01 ENCOUNTER — Encounter: Payer: Self-pay | Admitting: Neurology

## 2021-12-07 ENCOUNTER — Telehealth: Payer: PRIVATE HEALTH INSURANCE | Admitting: Adult Health

## 2021-12-07 ENCOUNTER — Ambulatory Visit: Payer: PRIVATE HEALTH INSURANCE | Admitting: Neurology

## 2023-02-13 ENCOUNTER — Telehealth: Payer: Self-pay | Admitting: Adult Health

## 2023-02-13 ENCOUNTER — Encounter: Payer: Self-pay | Admitting: Neurology

## 2023-02-13 NOTE — Telephone Encounter (Signed)
Last visit 08/2021 No visits since and none pending Unable to see any data on [facility] portal since 08/2021. Machine set up was 03/11/2015. Patient will need a new appointment for clinical information update and we will likely be ordering a sleep study for her to get a new machine.

## 2023-02-13 NOTE — Telephone Encounter (Signed)
Pt is calling stated her CPAP machine is say (MOTOR LIFE EXCEEDED). Pt is requesting a call back from nurse.

## 2023-02-15 ENCOUNTER — Ambulatory Visit (INDEPENDENT_AMBULATORY_CARE_PROVIDER_SITE_OTHER): Payer: No Typology Code available for payment source | Admitting: Neurology

## 2023-02-15 ENCOUNTER — Encounter: Payer: Self-pay | Admitting: Neurology

## 2023-02-15 VITALS — BP 103/67 | HR 78 | Ht 68.0 in | Wt 231.0 lb

## 2023-02-15 DIAGNOSIS — R0683 Snoring: Secondary | ICD-10-CM

## 2023-02-15 DIAGNOSIS — E6609 Other obesity due to excess calories: Secondary | ICD-10-CM | POA: Diagnosis not present

## 2023-02-15 DIAGNOSIS — F5102 Adjustment insomnia: Secondary | ICD-10-CM

## 2023-02-15 DIAGNOSIS — G4733 Obstructive sleep apnea (adult) (pediatric): Secondary | ICD-10-CM | POA: Diagnosis not present

## 2023-02-15 DIAGNOSIS — Z6835 Body mass index (BMI) 35.0-35.9, adult: Secondary | ICD-10-CM | POA: Insufficient documentation

## 2023-02-15 NOTE — Progress Notes (Addendum)
Provider:  Larey Seat, MD  Primary Care Physician:  Crist Infante, MD [address] [place] [address]     Referring Provider: Crist Infante, [place],   [id number]          Chief Complaint according to patient   Patient presents with:     New Patient (Initial Visit)           HISTORY OF PRESENT ILLNESS:  Shelley Bean is a 67 y.o. female patient who is here for revisit 02/15/2023 for CPAP follow up. She is married to Psychologist, sport and exercise Dr Mayer Camel, and used to work as an Therapist, sports.   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  She uses the machine 100% of all days and 97% of these days over 4 hours consecutively.  The overnight use time is 6-1/2 hours.  She is using an AutoSet AirSense 10 with a minimum pressure of 7 and maximum pressure setting of 10 and 3 cm expiratory pressure relief.   The residual AHI is 1.6/h which is excellent.  The 95th percentile pressure is 9.6 cm water.  She does not have significant air leakage.   It is actually remarkable how little air leakage various.  So her current interface seems to fit very well.  Her machine has send her a message : " exceeding  motor life" , it needs to be replaced.  She likes to use an SD card machine. It will require a prescription. I need a HST to re-document a baseline.     She endorsed the Epworth Sleepiness Scale at 0/24 points, FSS = fatigue severity scale at 11 / 63 points the geriatric depression score at 3 out of 15 points.  [employer] Dream ware  nasal cradle mask, small size. Halo size is medium      MM 09/07/21: Shelley Bean is a 67 year old female with a history of obstructive sleep apnea on CPAP.  She returns today for follow-up.  The patient has an older machine without the wireless capabilities.  She would like a new machine  but must repeat sleep test.  Insurance required another office visit.  Her download is attached to this note.  Her CPAP shows excellent compliance.  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 Joylene Draft for a sleep consultation.  Shelley Bean reports that she has trouble falling asleep rather than staying asleep.  in the morning She has also history of snoring, witnessed by her husband. He has not reported that the patient is apneic but that she seems to snore around 4 AM . The patient relates the changes in her sleep pattern to weight gain, over the last 4-5 years about 45 pounds.  She injured her back and was unable to exercise.   As long as she raised her young children she was a less sound sleeper and more in need of being aware of her sleep surrounding. That is no longer the case yet she feels that she has not returned to a sound sleeping pattern. She endorsed the fatigue severity scale at 22 points and the Epworth Sleepiness Scale at 3 points which are not indicative of either fatigue or daytime sleepiness being present. She  reports further that she likes to play on her computer or watching movies streaming on her computer and that the blue light or screen light seems to decrease her ability to fall asleep shortly after. Just recently she learned that this may be the cause of some of her sleep problems and she is in the process of implementing changes to her nocturnal habits. Shelley Bean  husband has notched her at times to roll over and she will not snore when not sleeping on her back or side, is still while sleeping prone.  She was told that she always breathes through the mouth. She has had a physical exam recently and was told by Dr. Joylene Draft that her upper air way is narrow. 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   History from 05-05-15 Shelley Bean is here for his first follow-up after her split night polysomnography which performed on 03-01-15. The patient has  a very high RDI and AHI of 50.3 REM sleep was not seen and she spent none of her sleep in supine position. She had some periodic limb movements but none let to arousals her heart rate was stable she had isolated ectopies. After CPAP was initiated it was advanced to 9 cm water which she seemed to tolerate well and nasal mask by [employer] in medium size was used. The patient has been slowly getting adjusted to it but she said it was a challenge because she prefers to sleep problem and this is not possible with her headgear on. She was rather surprised that she would have that severe degree of sleep apnea ring was also noted. She wakes up with impressions on her face from the nasal mask and she would probably prefer a pillow. Interested in a dream where mask and I can certainly change her prescription to that. We also met today to look at her download. She used the machine 100% of the time for the last 30 days and 29 days over 4 hours which makes and 97% compliant a good results. On average she sleeps 6 hours 35 minutes with the CPAP machine set at 9 cm water pressure with 3 cm EPR. She does have an AHI of 3.1. She does not use the humidifier.         Review of Systems: Out of a complete 14 system review, the patient complains of only the following symptoms, and all other reviewed systems are negative.:  Fatigue, sleepiness , snoring, fragmented sleep,  Nocturia- 3 -5 times- sicne being on high hydration.  Prediabetic.  How likely are you to doze in the following situations: 0 = not likely, 1 = slight chance, 2 = moderate chance, 3 = high chance   Sitting and Reading? Watching Television? Sitting inactive in a public place (theater or meeting)? As a passenger in a car for an hour without a break? Lying down in the afternoon when circumstances permit? Sitting and talking to someone? Sitting quietly after lunch without alcohol? In a car, while stopped for a few minutes in traffic?   Total = 0/  24 points   FSS endorsed at 11/ 63 points.   Social History   Socioeconomic History   Marital status: Married    Spouse name: Not on file   Number of children: 3   Years of education: RN   Highest education level: Not on file  Occupational History   Not on file  Tobacco Use   Smoking status: Never   Smokeless tobacco: Never  Substance and Sexual Activity   Alcohol use: Yes    Alcohol/week: 0.0 standard drinks of alcohol    Comment: socialholidays   Drug use: No   Sexual activity: Not on file  Other Topics Concern   Not on file  Social History Narrative   Caffeine 1-2 cups daily avg.    Social Determinants of Health   Financial Resource Strain: Not on file  Food Insecurity: Not on file  Transportation Needs: Not on file  Physical Activity: Not on file  Stress: Not on file  Social Connections: Not on file    Family History  Problem Relation Age of Onset   Pulmonary fibrosis Father    Pulmonary Hypertension Mother     Past Medical History:  Diagnosis Date   Tear of meniscus of left knee 12/2014   wearing brace, doing PT    Past Surgical History:  Procedure Laterality Date   CESAREAN SECTION     TONSILLECTOMY     WISDOM TOOTH EXTRACTION       Current Outpatient Medications on File Prior to Visit  Medication Sig Dispense Refill   aspirin 81 MG chewable tablet Chew 81 mg by mouth at bedtime.      Cholecalciferol (VITAMIN D3) 5000 UNITS CAPS Take 5,000 Units by mouth at bedtime.     Coenzyme Q10 (COQ10) 200 MG CAPS Take 200 mg by mouth as needed.     doxylamine, Sleep, (UNISOM) 25 MG tablet Take 12.5 mg by mouth at bedtime as needed.      Magnesium 250 MG TABS Take 250 mg by mouth at bedtime.     Melatonin 3 MG TABS Take 3 mg by mouth at bedtime.      olmesartan-hydrochlorothiazide (BENICAR HCT) 20-12.5 MG tablet Take 0.5 tablets by mouth daily.     rosuvastatin (CRESTOR) 10 MG tablet Take 10 mg by mouth daily.     tirzepatide [facility]) 5 MG/0.5ML Pen Inject  5 mg into the skin once a week.     UNABLE TO FIND Med Name: Ginkgo Biloba     vitamin B-12 (CYANOCOBALAMIN) 100 MCG tablet Take 100 mcg by mouth daily.     No current facility-administered medications on file prior to visit.    Allergies  Allergen Reactions   Demerol [Meperidine] Other (See Comments)    paradoxical reaction (makes her hyper)     DIAGNOSTIC DATA (LABS, IMAGING, TESTING) - I reviewed patient records, labs, notes, testing and imaging myself where available.  Lab Results  Component Value Date   WBC 9.5 01/14/2017   HGB 15.5 (H) 01/14/2017   HCT 45.4 01/14/2017   MCV 90.1 01/14/2017   PLT 194 01/14/2017      Component Value Date/Time   NA 138 01/14/2017 1147   K 4.0 01/14/2017 1147   CL 102 01/14/2017 1147   CO2 26 01/14/2017 1147   GLUCOSE 127 (H) 01/14/2017 1147   BUN 14 01/14/2017 1147   CREATININE 0.84 01/14/2017 1147   CALCIUM 9.4 01/14/2017 1147   PROT 7.0 11/28/2013 0520   ALBUMIN 3.7 11/28/2013 0520   AST 16 11/28/2013 0520   ALT 20 11/28/2013 0520   ALKPHOS 72 11/28/2013 0520   BILITOT 0.3 11/28/2013 0520   GFRNONAA >60 01/14/2017 1147   GFRAA >60 01/14/2017 1147   No results found for: "CHOL", "HDL", "LDLCALC", "LDLDIRECT", "TRIG", "CHOLHDL" No results found for: "HGBA1C" No results found for: "VITAMINB12" No results found for: "TSH"  PHYSICAL EXAM:  Today's Vitals   02/15/23 0927  BP: 103/67  Pulse: 78  Weight: 231 lb (104.8 kg)  Height: '5\' 8"'$  (1.727 m)   Body mass index is 35.12 kg/m.   Wt Readings from Last 3 Encounters:  02/15/23 231 lb (104.8 kg)  09/07/21 242 lb (109.8 kg)  09/19/18 239 lb (108.4 kg)     Ht Readings from Last 3 Encounters:  02/15/23 '5\' 8"'$  (1.727 m)  09/07/21 '5\' 8"'$  (1.727 m)  09/19/18 '5\' 8"'$  (1.727 m)      General: The patient is awake, alert and appears not in acute distress. The patient is well groomed. Head: Normocephalic, atraumatic. Neck is supple.  Mallampati 3, small oral opening.  neck  circumference:14 inches . Nasal airflow  patent.  Retrognathia is  seen.  Dental status: biological  Cardiovascular:  Regular rate and cardiac rhythm by pulse,  without distended neck veins. Respiratory: Lungs are clear to auscultation.  Skin:  Without evidence of ankle edema, or rash. Trunk: The patient's posture is erect.   NEUROLOGIC EXAM: The patient is awake and alert, oriented to place and time.   Memory subjective described as intact.  Attention span & concentration ability appears normal.  Speech is fluent,  without  dysarthria, dysphonia or aphasia.  Mood and affect are appropriate.   Cranial nerves: no loss of smell or taste reported  Pupils are equal and briskly reactive to light. Funduscopic exam deferred. .  Extraocular movements in vertical and horizontal planes were intact and without nystagmus. No Diplopia. Visual fields by finger perimetry are intact. Hearing was intact to soft voice and finger rubbing.    Facial sensation intact to fine touch.  Facial motor strength : right facial droop- asymmetry, but Tongue and uvula move midline.  Neck ROM : rotation, tilt and flexion extension were restricted with turn to the left  , her shoulder shrug was asymmetrical.    Motor exam:  Symmetric bulk, tone and ROM.  knee pain reported.  Normal tone without cog -wheeling, symmetric grip strength .   Sensory:  Fine touch, pinprick and vibration were normal.  Proprioception tested in the upper extremities was normal.   Coordination: Rapid alternating movements in the fingers/hands were of normal speed.  The Finger-to-nose maneuver was intact without evidence of ataxia, dysmetria or tremor.   Gait and station: Patient could rise unassisted from a seated position, walked without assistive device.  Stance is of normal width/ base .  Toe and heel walk were deferred.  Deep tendon reflexes: in the  upper and lower extremities are symmetric and intact.  Babinski response was deferred  .    ASSESSMENT AND PLAN 67 y.o. year old female  here with:OSA on CPAP, RESMED S 10 for her- autoset that needs replaced, [employer] Dream ware nasal cradle mask, small size. Halo size is medium     1) OSA history with over a decade of CPAP therapy (2009) , patient has other sleep quality affecting conditions, presenting  with knee arthritis, prediabetes and with obesity and small oral opening.   2) High deductible Health Plan - patient will need to purchase online CPAP with our prescription  to avoid  much higher price per DME quote- and I like to obtain a HST baseline for her - its 8 years since last tested.  She uses an Banker, RESMED for her .   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   PLAN : HST ordered, I think her baseline may have shifted  to higher degree of AHI/ OSA by weight, muscle tone and a reduced activity level.  CPAP prescription was written for REsMED S 10  autoset for her.  Uses a [employer]  nasal cradle mask, small size. Halo size is medium . May get a better price if she considers a similar model , the [place] .   The patient will not have to fulfill insurance mandated compliance as she owns the machine outright.     I plan to follow up either personally or through our NP within 4-5 months after she started on her new CPAP. .   I would like to thank Crist Infante, MD and Crist Infante, [place],  [place] [address] for allowing me to meet with and to take care of this pleasant patient.   CC: I will share my notes with PCP .  After spending a total time of  35  minutes face to face and additional time for physical and neurologic examination, review of laboratory studies,  personal review of imaging studies, reports and results of other testing and review of referral information / records as far as provided in visit,   Electronically signed by: Larey Seat, MD 02/15/2023 9:45  AM  [facility] and [facility] Sleep Board certified by The [employer] of Sleep Medicine and Diplomate of the [employer] of Sleep Medicine. Board certified In Neurology through the [place], Fellow of the [employer] of Neurology. Medical Director of [employer].

## 2023-02-15 NOTE — Patient Instructions (Signed)

## 2023-03-09 ENCOUNTER — Telehealth: Payer: Self-pay

## 2023-03-09 NOTE — Telephone Encounter (Signed)
Talked with patient about the HST. Unfortunately at this time, her insurance has a very high deductible. She would like to wait until next year when she gets Medicare coverage.   She also mentioned that she may try to do a HST on her own, from an online provider. I did advise patient that Dr. Brett Fairy may not be able to use those results. Pt would have to do sleep study through our office. Pt stated she understood.

## 2023-03-09 NOTE — Telephone Encounter (Signed)
LVM for pt to call back to schedule sleep study.  

## 2023-05-01 ENCOUNTER — Ambulatory Visit: Payer: No Typology Code available for payment source | Admitting: Neurology

## 2024-09-17 NOTE — Therapy (Addendum)
 OUTPATIENT PHYSICAL THERAPY FEMALE PELVIC EVALUATION   Patient Name: Shelley Bean MRN: [id number] DOB:1956-02-22, 68 y.o., female Today's Date: 09/18/2024  END OF SESSION:  PT End of Session - 09/18/24 1411     Visit Number 1    Date for Recertification  12/11/24    Authorization Type Medicare    PT Start Time 1411   came late   PT Stop Time 1440    PT Time Calculation (min) 29 min    Activity Tolerance Patient tolerated treatment well    Behavior During Therapy [facility] for tasks assessed/performed          Past Medical History:  Diagnosis Date   Tear of meniscus of left knee 12/2014   wearing brace, doing PT   Past Surgical History:  Procedure Laterality Date   CESAREAN SECTION     TONSILLECTOMY     WISDOM TOOTH EXTRACTION     Patient Active Problem List   Diagnosis Date Noted   Class 2 obesity due to excess calories without serious comorbidity with body mass index (BMI) of 35.0 to 35.9 in adult 02/15/2023   OSA on CPAP 05/04/2016   Shifting sleep-work schedule, affecting sleep 03/11/2015   Obesity, morbid (HCC) 03/11/2015   Insomnia due to stress 01/13/2015   Severe obesity (BMI >= 40) (HCC) 01/13/2015   Snoring 01/13/2015    PCP: Shayne Anes, MD  REFERRING PROVIDER: Mat Browning, MD   REFERRING DIAG: N94.2 (ICD-10-CM) - Vaginismus   THERAPY DIAG:  Cramp and spasm  Muscle weakness (generalized)  Other lack of coordination  Rationale for Evaluation and Treatment: Rehabilitation  ONSET DATE: 12/2023  SUBJECTIVE:                                                                                                                                                                                           SUBJECTIVE STATEMENT: Patient had 3 treatments of the [place] this year. Pain with intercourse was worse.  Fluid intake: water, electrolytes  FUNCTIONAL LIMITATIONS: none  PERTINENT HISTORY:  Medications for current condition: none Surgeries:  Cesarean section Other: see above Sexual abuse: No   PAIN:  Are you having pain? Yes NPRS scale: 5/10 Pain location: Vaginal  Pain type: sharp Pain description: intermittent   Aggravating factors: penile penetration, vaginal exam Relieving factors: no penile penetration  PRECAUTIONS: None  RED FLAGS: None   WEIGHT BEARING RESTRICTIONS: No  FALLS:  Has patient fallen in last 6 months? No  OCCUPATION: retired  ACTIVITY LEVEL : wt. Machines, elliptical, free wts.   PLOF: Independent  PATIENT GOALS: no pain with penetration vaginally   BOWEL  MOVEMENT: Pain with bowel movement: No Type of bowel movement: if not hydrated she will be constipated Fully empty rectum: Yes:   Leakage: No                                                     Pads: No Fiber supplement/laxative Miralax  URINATION: Pain with urination: No Fully empty bladder: Yes:                                  Post-void dribble: Yes later in her pad Stream: Weak, get up in the middle of the night will be a slow trickle Urgency: Yes , when  she has a full toilet Frequency:during the day 3-4                                                         Nocturia: Yes: 2-3 times   Leakage: Walking to the bathroom, Coughing, Sneezing, Laughing, Exercise, and Lifting Pads/briefs: Yes: 1 per day panty liner  INTERCOURSE:  Ability to have vaginal penetration No  Pain with intercourse: Pain Interrupts Intercourse; the penis is not able to enter the vagina Dryness: Yes  Climax: yes Marinoff Scale: 3/3 Lubricant:yes  PREGNANCY: Vaginal deliveries 1 Tearing Yes: 4 th degree tear Episiotomy No C-section deliveries 2 Currently pregnant No  PROLAPSE: None   OBJECTIVE:  Note: Objective measures were completed at Evaluation unless otherwise noted.  DIAGNOSTIC FINDINGS:  none  PATIENT SURVEYS:   FSFI Score = 15.9  ? The individual domain scores and the overall FSFI score are described  below:  Domain Items Factor Score Desire 1, 2 0.6 1.8 Arousal 3, 4, 5, 6 0.3 2.4 Lubrication 7, 8, 9, 10 0.3 2.1 Orgasm 11, 12, 13 0.4 4.8 Satisfaction 14, 15, 16 0.4 2.8 Pain 17, 18, 19 0.4 2 FSFI score - - 15.9 Disclaimer: This tool should NOT be considered as a substitute for any professional medical service, NOR as a substitute for clinical judgement.  COGNITION: Overall cognitive status: Within functional limits for tasks assessed     SENSATION: Light touch: Appears intact      POSTURE: rounded shoulders, forward head, and decreased lumbar lordosis   LUMBARAROM/PROM:  A/PROM A/PROM  Eval (% available)  Flexion Full with tightness in lumbar   Extension 25%  Right lateral flexion 50%  Left lateral flexion 50%  Right rotation 50%  Left rotation 50%   (Blank rows = not tested)  LOWER EXTREMITY ROM:  Passive ROM Right eval Left eval  Hip external rotation 50 50   (Blank rows = not tested)  LOWER EXTREMITY MMT:  MMT Right eval Left eval  Hip extension 4/5 3/5  Hip abduction 3/5 4/5   (Blank rows = not tested) PALPATION:  Abdominal: decreased movement of the lower rib cage  Diastasis: No Distortion: No  Scar tissue: Yes: restrictions                External Perineal Exam: redness at the perineal body where 4th degree tear with vaginal birth was; redness along the sides of the vulva; Negative q-tip  test                             Internal Pelvic Floor: tenderness located on the levator ani, obturator internist, along the urethra, patient was very anxious with the pelvic floor assessment  Patient confirms identification and approves PT to assess internal pelvic floor and treatment Yes No emotional/communication barriers or cognitive limitation. Patient is motivated to learn. Patient understands and agrees with treatment goals and plan. PT explains patient will be examined in standing, sitting, and lying down to see how their muscles and joints work. When  they are ready, they will be asked to remove their underwear so PT can examine their perineum. The patient is also given the option of providing their own chaperone as one is not provided in our facility. The patient also has the right and is explained the right to defer or refuse any part of the evaluation or treatment including the internal exam. With the patient's consent, PT will use one gloved finger to gently assess the muscles of the pelvic floor, seeing how well it contracts and relaxes and if there is muscle symmetry. After, the patient will get dressed and PT and patient will discuss exam findings and plan of care. PT and patient discuss plan of care, schedule, attendance policy and HEP activities.   PELVIC MMT:   MMT eval  Vaginal 1/5  (Blank rows = not tested)        TONE: increased  PROLAPSE: none  TODAY'S TREATMENT:                                                                                                                              DATE: 09/18/24  EVAL Examination completed, findings reviewed, pt educated on POC, HEP, and female pelvic floor anatomy, reasoning with pelvic floor assessment internally with pt consent,. Pt motivated to participate in PT and agreeable to attempt recommendations.     PATIENT EDUCATION:  Education details: education on vaginal trainers, gave information on sex therapy Person educated: Patient Education method: Explanation, Demonstration, Tactile cues, Verbal cues, and Handouts Education comprehension: verbalized understanding, returned demonstration, verbal cues required, tactile cues required, and needs further education  HOME EXERCISE PROGRAM: See above.   ASSESSMENT:  CLINICAL IMPRESSION: Patient is a 68 y.o. female who was seen today for physical therapy evaluation and treatment for vaginismus. Patient is not able to have penile penetration due to pain level so high.. Patient will  leak urine with Walking to the bathroom, coughing,  sneezing, laughing, exercise, and lifting. She wears a 1 panty liner per day. Pelvic floor strength was 1/5. She will dribble urine after she urinates. She urinates 3-4 times per night. Negative Q-tip test. Therapist was able to place her gloved index finger in the vaginal canal. Patient presented with tenderness along the levator ani and obturator internist. She has redness and restrictions along the perineal body  with redness. Patient has decreased lumbar mobility, tightness in the lumbar and posterior hips. Patient will benefit from skilled therapy to improve mobility of her pelvic floor tissue for penetration and reduce her urinary leakage.   OBJECTIVE IMPAIRMENTS: decreased coordination, decreased mobility, decreased ROM, decreased strength, increased fascial restrictions, increased muscle spasms, and pain.   ACTIVITY LIMITATIONS: continence and toileting  PARTICIPATION LIMITATIONS: interpersonal relationship  PERSONAL FACTORS: Time since onset of injury/illness/exacerbation are also affecting patient's functional outcome.   REHAB POTENTIAL: Excellent  CLINICAL DECISION MAKING: Evolving/moderate complexity  EVALUATION COMPLEXITY: Moderate   GOALS: Goals reviewed with patient? Yes  SHORT TERM GOALS: Target date: 10/16/24  Patient educated on vaginal health.  Baseline: Goal status: INITIAL  2.  Patient educated on vaginal trainers, some she can look into and where she should find them.  Baseline:  Goal status: INITIAL  3.  Patient educated on how to use vaginal trainers to expand the introitus.  Baseline:  Goal status: INITIAL  4.  Patient able to perform diaphragmatic breathing to perform a pelvic drop.  Baseline:  Goal status: INITIAL   LONG TERM GOALS: Target date: 12/12/24  Patient independent with advanced HEP for pelvic floor elongation to reduce leakage and pain.  Baseline:  Goal status: INITIAL  2.  Patient is on the last vaginal trainer to elongate the vaginal  canal for penetration with pain </= 1-2/10.  Baseline:  Goal status: INITIAL  3.  Patient is able to contract and fully relax the pelvic floor for strength >/= 3/5 to reduce her overall urinary leakage >/= 75%.  Baseline:  Goal status: INITIAL  4.  Patient is able to lift items with pelvic floor contraction and reduction of abdominal pressure to reduce her urinary leakage >/= 75%.  Baseline:  Goal status: INITIAL   PLAN:  PT FREQUENCY: 1-2x/week  PT DURATION: 12 weeks  PLANNED INTERVENTIONS: 97110-Therapeutic exercises, 97530- Therapeutic activity, 97112- Neuromuscular re-education, 97535- Self Care, 02859- Manual therapy, 20560 (1-2 muscles), 20561 (3+ muscles)- Dry Needling, Patient/Family education, Joint mobilization, Spinal mobilization, Scar mobilization, Cryotherapy, Moist heat, and Biofeedback  PLAN FOR NEXT SESSION: go over dilators, diaphragmatic breathing, guided meditation, hip stretches, work on posterior chain elongation   Channing Pereyra, PT 09/18/24 5:06 PM  PHYSICAL THERAPY DISCHARGE SUMMARY  Visits from Start of Care: 1  Current functional level related to goals / functional outcomes: See above. Patient called to cancel her appointments due to feeling better.    Remaining deficits: See above.    Education / Equipment: HEP   Patient agrees to discharge. Patient goals were not met. Patient is being discharged due to being pleased with the current functional level. Thank you for the referral.   Channing Pereyra, PT 11/19/24 10:31 AM

## 2024-09-18 ENCOUNTER — Encounter: Payer: Self-pay | Admitting: Physical Therapy

## 2024-09-18 ENCOUNTER — Ambulatory Visit: Payer: PRIVATE HEALTH INSURANCE | Attending: Obstetrics and Gynecology | Admitting: Physical Therapy

## 2024-09-18 ENCOUNTER — Other Ambulatory Visit: Payer: Self-pay

## 2024-09-18 DIAGNOSIS — R252 Cramp and spasm: Secondary | ICD-10-CM | POA: Insufficient documentation

## 2024-09-18 DIAGNOSIS — R278 Other lack of coordination: Secondary | ICD-10-CM | POA: Diagnosis present

## 2024-09-18 DIAGNOSIS — M6281 Muscle weakness (generalized): Secondary | ICD-10-CM | POA: Insufficient documentation

## 2024-09-18 NOTE — Patient Instructions (Signed)
 Vaginal trainers  Prior to Use:   Wash the vaginal trainer with soap and water before and after each use.   Use a water-soluble lubricant like Slippery Stuff or Surgulibe.   Avoid using Vaseline, coconut oil, or other oil-based lubricants. They are not water-soluble and can be irritating to the tissues in the vagina.   Do not use silicon-based lubricants with a silicon vaginal trainer. Using a siliconbased lubricant with a silicon device can contribute to break down of the material.  Setting Up Your Space   Work in a comfortable room lying on your back on a bed or couch with your knees bent and knees relaxed open. Use pillows underneath your knees as they are relaxed open and to support your upper back and head. Place a towel underneath your bottom to collect any lubricant.   Place your vaginal trainers and lubricant on a towel next to you within arm's reach for easy access.   Starting to use your trainer:  o Take 10-20 deep breaths to quiet your nervous system  o Perform stretches to help relax your hips and pelvic floor such as child's pose, cat/cow, or happy baby pose  Using Your Trainers   Coat the smallest vaginal trainer, or the size you are most comfortable using, with lubricant   Place the tip of the trainer at the opening to the vagina.   Take a few deep breaths to adjust to the sensation of the lubricant and trainer.   Slowly insert the rounded end of the trainer into your vaginal opening as far as you are comfortable. Pause and breathe if you experience pain, tension, or muscle guarding at any time. Once you feel comfortable gently slide trainer deeper into the vaginal canal as far as it will go without causing pain or discomfort.  Progressing with your Trainers   Gently press the trainer toward the bottom and sides of the vaginal opening to give it a gentle stretch. Pause and breathe at each spot and tension melts away.   Once fully inserted, turn the trainer clockwise and  counterclockwise to produce different sensations   Slowly move the trainer in and out as you breathe and focus on staying as relaxed as possible  To progress to the next size gradually, once one trainer is completely pain-free and comfortable to use, insert that smaller trainer first for 5-10 minutes and then follow with the next largest size trainer for 5-10 minutes. Gradually decrease the length of time using the smaller trainer as you increase the length of time using the larger trainer.   Move at your pace ad what's comfortable for you.  Wrapping up your session   Use trainers for 5-10 minutes every other day or 3 times a week   Wash and dry your vaginal trainers after use  Other considerations: Try to approach using vaginal trainers from a place of curiosity instead of judgment - what can my body do today, vs. why can't it do this, or I should be able to do this. Try letting go of that idea that it should be different, and try to meet yourself where you are at, without the pressure to change anything Do you bring your vaginal trainers into PT? Sometimes, bringing your trainers into sessions with your PT and having them talk you through the process while you are in control of the trainer can be helpful. Maybe they can help you find ways to make insertion a bit easier for you, or they can  help remind you to breathe. If you aren't doing this, I definitely recommend talking to a PT about it. Sometimes knowing the physical tools you have can help with the mental game. One thing that can be helpful to do before jumping to dilating is called "cupping". It is just taking your hand and holding your palm to your vulva and breathing. Doing this before doing any type of trainer training can be helpful as it lets you take a second and check in, vs jumping right in. Kind of like a warm up to your workout! Try different tools and see if you like another one more. Some of our patients prefer crystal wands or  plastic trainers to silicone, some prefer starting with a vibrating pelvic wand instead of a trainer, look at different options and see what interests you. You can also try different lubricants. And don't feel as though you need to jump right into inserting anything. The first few times (or minutes of the session) may just be about putting it at the entrance without inserting, and that's ok! We have also had patients find success with an external vibrator on their pubic bone while they use trainers as this can help distract nerves and increase muscle relaxation. This can be helpful to normalize the trainers. Leave the one you are currently using and the one you want to progress to somewhere you see it every day, like the bathroom counter or the bedside table. Seeing them every day can make them less intimidating. The more you do something, the more routine it becomes, so setting a vaginal trainers schedule and sticking to it can help make it less intimidating. Last thing that could be helpful to you is to set yourself up a relaxing environment when you use your vaginal trainers. Play your favorite calming music, light your favorite candle, incense, or turn on your diffuser, wear your coziest t-shirt and socks, prop your legs on pillows, anything that feels like a big exhale. Don't distract yourself with tv or a movie. Stay tuned into your body to help maintain relaxation Listening to relaxing music or meditations can also be helpful. Preferences for guided meditations can be so different from person to person so find one you feel relaxed/safe with!   Pelvic Floor Vaginal dilators                                                             Amielle Restore Vaginal Dilator Kit                                                        Vulva Tech                                                                              Restore  Soul Source                                                                                    Intimate Rose                                                                                        Inspire Silicone Dilator Set                                                                                 V Well  Buyer, retail that you pump                                                                               Conservation officer, historic buildings                                                                  Vaginismus Vaginal dilators  Oh Nut for deep vaginal penetration limitation  Most of these dilators you can get on [employer]. The ones you are not able to do then look at the company website or [email or web address].hunt.info/    [facility] [address] [place], [place] [address] Phone # [phone] Fax [phone]

## 2024-11-06 ENCOUNTER — Encounter: Admitting: Physical Therapy

## 2024-11-11 ENCOUNTER — Ambulatory Visit: Attending: Obstetrics and Gynecology | Admitting: Physical Therapy

## 2024-11-11 DIAGNOSIS — R278 Other lack of coordination: Secondary | ICD-10-CM | POA: Insufficient documentation

## 2024-11-11 DIAGNOSIS — M6281 Muscle weakness (generalized): Secondary | ICD-10-CM | POA: Insufficient documentation

## 2024-11-11 DIAGNOSIS — R252 Cramp and spasm: Secondary | ICD-10-CM | POA: Insufficient documentation

## 2024-11-20 ENCOUNTER — Encounter: Admitting: Physical Therapy

## 2024-11-27 ENCOUNTER — Encounter: Admitting: Physical Therapy

## 2024-12-04 ENCOUNTER — Encounter: Admitting: Physical Therapy
